# Patient Record
Sex: Female | Born: 1960
Health system: Southern US, Community
[De-identification: ages and names within clinical notes are randomized; demographics above are authoritative.]

## PROBLEM LIST (undated history)

## (undated) DIAGNOSIS — R32 Unspecified urinary incontinence: Secondary | ICD-10-CM

## (undated) DIAGNOSIS — C801 Malignant (primary) neoplasm, unspecified: Secondary | ICD-10-CM

## (undated) DIAGNOSIS — M1990 Unspecified osteoarthritis, unspecified site: Secondary | ICD-10-CM

## (undated) DIAGNOSIS — IMO0002 Reserved for concepts with insufficient information to code with codable children: Secondary | ICD-10-CM

## (undated) DIAGNOSIS — R87810 Cervical high risk human papillomavirus (HPV) DNA test positive: Secondary | ICD-10-CM

## (undated) HISTORY — DX: Cervical high risk human papillomavirus (HPV) DNA test positive: R87.810

## (undated) HISTORY — PX: LASIK: SHX215

## (undated) HISTORY — DX: Unspecified osteoarthritis, unspecified site: M19.90

## (undated) HISTORY — PX: TONSILLECTOMY AND ADENOIDECTOMY: SHX28

## (undated) HISTORY — PX: COLPOSCOPY: SHX161

## (undated) HISTORY — DX: Reserved for concepts with insufficient information to code with codable children: IMO0002

## (undated) HISTORY — DX: Malignant (primary) neoplasm, unspecified: C80.1

## (undated) HISTORY — DX: Unspecified urinary incontinence: R32

---
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
## 1998-04-23 ENCOUNTER — Ambulatory Visit (HOSPITAL_COMMUNITY): Admission: RE | Admit: 1998-04-23 | Discharge: 1998-04-23 | Payer: Self-pay | Admitting: Obstetrics and Gynecology

## 1998-10-06 ENCOUNTER — Other Ambulatory Visit: Admission: RE | Admit: 1998-10-06 | Discharge: 1998-10-06 | Payer: Self-pay | Admitting: Obstetrics and Gynecology

## 1998-10-14 ENCOUNTER — Ambulatory Visit (HOSPITAL_COMMUNITY): Admission: RE | Admit: 1998-10-14 | Discharge: 1998-10-14 | Payer: Self-pay | Admitting: Obstetrics and Gynecology

## 1999-08-23 ENCOUNTER — Other Ambulatory Visit: Admission: RE | Admit: 1999-08-23 | Discharge: 1999-08-23 | Payer: Self-pay | Admitting: Gynecology

## 2000-03-24 ENCOUNTER — Inpatient Hospital Stay (HOSPITAL_COMMUNITY): Admission: AD | Admit: 2000-03-24 | Discharge: 2000-03-27 | Payer: Self-pay | Admitting: Obstetrics and Gynecology

## 2000-05-14 ENCOUNTER — Other Ambulatory Visit: Admission: RE | Admit: 2000-05-14 | Discharge: 2000-05-14 | Payer: Self-pay | Admitting: Gynecology

## 2001-05-29 ENCOUNTER — Other Ambulatory Visit: Admission: RE | Admit: 2001-05-29 | Discharge: 2001-05-29 | Payer: Self-pay | Admitting: Gynecology

## 2001-10-29 ENCOUNTER — Other Ambulatory Visit: Admission: RE | Admit: 2001-10-29 | Discharge: 2001-10-29 | Payer: Self-pay | Admitting: Gynecology

## 2002-04-01 ENCOUNTER — Other Ambulatory Visit: Admission: RE | Admit: 2002-04-01 | Discharge: 2002-04-01 | Payer: Self-pay | Admitting: Gynecology

## 2003-12-08 ENCOUNTER — Other Ambulatory Visit: Admission: RE | Admit: 2003-12-08 | Discharge: 2003-12-08 | Payer: Self-pay | Admitting: Gynecology

## 2004-01-12 ENCOUNTER — Ambulatory Visit (HOSPITAL_COMMUNITY): Admission: RE | Admit: 2004-01-12 | Discharge: 2004-01-12 | Payer: Self-pay | Admitting: Gynecology

## 2004-08-09 ENCOUNTER — Other Ambulatory Visit: Admission: RE | Admit: 2004-08-09 | Discharge: 2004-08-09 | Payer: Self-pay | Admitting: Gynecology

## 2005-01-24 ENCOUNTER — Other Ambulatory Visit: Admission: RE | Admit: 2005-01-24 | Discharge: 2005-01-24 | Payer: Self-pay | Admitting: Gynecology

## 2005-07-12 ENCOUNTER — Ambulatory Visit (HOSPITAL_COMMUNITY): Admission: RE | Admit: 2005-07-12 | Discharge: 2005-07-12 | Payer: Self-pay | Admitting: Gynecology

## 2005-07-24 ENCOUNTER — Encounter: Admission: RE | Admit: 2005-07-24 | Discharge: 2005-07-24 | Payer: Self-pay | Admitting: Gynecology

## 2006-01-29 ENCOUNTER — Other Ambulatory Visit: Admission: RE | Admit: 2006-01-29 | Discharge: 2006-01-29 | Payer: Self-pay | Admitting: Gynecology

## 2006-07-13 ENCOUNTER — Ambulatory Visit (HOSPITAL_BASED_OUTPATIENT_CLINIC_OR_DEPARTMENT_OTHER): Admission: RE | Admit: 2006-07-13 | Discharge: 2006-07-13 | Payer: Self-pay | Admitting: Orthopedic Surgery

## 2006-07-25 ENCOUNTER — Ambulatory Visit (HOSPITAL_COMMUNITY): Admission: RE | Admit: 2006-07-25 | Discharge: 2006-07-25 | Payer: Self-pay | Admitting: Gynecology

## 2007-02-07 ENCOUNTER — Other Ambulatory Visit: Admission: RE | Admit: 2007-02-07 | Discharge: 2007-02-07 | Payer: Self-pay | Admitting: Gynecology

## 2007-07-29 ENCOUNTER — Ambulatory Visit (HOSPITAL_COMMUNITY): Admission: RE | Admit: 2007-07-29 | Discharge: 2007-07-29 | Payer: Self-pay | Admitting: Gynecology

## 2008-04-02 ENCOUNTER — Other Ambulatory Visit: Admission: RE | Admit: 2008-04-02 | Discharge: 2008-04-02 | Payer: Self-pay | Admitting: Gynecology

## 2008-09-01 ENCOUNTER — Ambulatory Visit (HOSPITAL_COMMUNITY): Admission: RE | Admit: 2008-09-01 | Discharge: 2008-09-01 | Payer: Self-pay | Admitting: Gynecology

## 2009-04-06 ENCOUNTER — Other Ambulatory Visit: Admission: RE | Admit: 2009-04-06 | Discharge: 2009-04-06 | Payer: Self-pay | Admitting: Gynecology

## 2009-04-06 ENCOUNTER — Ambulatory Visit: Payer: Self-pay | Admitting: Gynecology

## 2009-04-06 ENCOUNTER — Encounter: Payer: Self-pay | Admitting: Gynecology

## 2009-11-11 ENCOUNTER — Ambulatory Visit (HOSPITAL_COMMUNITY): Admission: RE | Admit: 2009-11-11 | Discharge: 2009-11-11 | Payer: Self-pay | Admitting: Gynecology

## 2010-05-19 ENCOUNTER — Encounter: Payer: Self-pay | Admitting: Internal Medicine

## 2010-10-02 HISTORY — PX: HAND SURGERY: SHX662

## 2010-11-03 NOTE — Letter (Signed)
Summary: [facility]  [facility]   Imported By: Sherian Rein 05/31/2010 14:06:52  _____________________________________________________________________  External Attachment:    Type:   Image     Comment:   External Document

## 2011-02-17 NOTE — Discharge Summary (Signed)
[facility]  PatientALIANAH, [name]                            MRN: [id number] Adm. Date:  [id number] Disc. Date: [id number] Attending:  Tonye Royalty Dictator:   Antony Contras, N.P. CC:         Nadyne Coombes. Fontaine, M.D.                           Discharge Summary  DISCHARGE DIAGNOSIS:          Intrauterine pregnancy at term, spontaneous                               with spontaneous onset of labor.   PROCEDURE:                    Vacuum-assisted vaginal delivery over a midline                               episiotomy, with delivery of a viable female                               infant.  HISTORY OF PRESENT ILLNESS:   The patient is a 50 year old gravida 3, para 1, 0, 1, 1, with an LMP of June 12, 1999, [facility] of March 18, 2000.  The prenatal course was complicated by the advanced maternal age.  The prenatal labs were as follows: Blood type O-positive, antibody screen negative, rubella, HBSAG, HIV nonreactive. Rubella immune.  SMAFP within normal limits.  Amniocentesis revealed normal chromosomes.  GBS culture was positive.  HOSPITAL COURSE:              The patient was admitted on March 24, 2000, with contractions two to four minutes apart.  The cervix on admission was 2.0 cm, 90%, vertex at a -1 station.  Labor was augmented with Pitocin.  The patient was also given clindamycin for a positive GBS status.  She did progress to complete dilatation and was delivered of an Apgar 7 and 8 female, infant weighing 7 pounds 7 ounces over a midline episiotomy.  Delivery was accomplished with a Tender-Touch vacuum, secondary to deep variable decelerations.  The postpartum course was uncomplicated.  She remained afebrile.  No difficulty voiding.  A CBC: Hematocrit 37.1, hemoglobin 12.6, wbcs 13.2, platelets 141.  She was able to be discharged on her second postpartum day in satisfactory condition.  DISPOSITION:                  Follow up in  the office in six weeks.  Continue with prenatal vitamins, Motrin and Tylox for pain. DD:  04/23/00 TD:  04/25/00 Job: [id number]

## 2011-02-17 NOTE — Op Note (Signed)
NAMECASSADIE, PANKONIN                   ACCOUNT NO.:  [id number]   MEDICAL RECORD NO.:  [id number]          PATIENT TYPE:  AMB   LOCATION:  DSC                          FACILITY:  [facility]   PHYSICIAN:  Katy Fitch. Sypher, M.D. 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   OPERATIONS:  Excisional debridement of mucoid cyst and irrigation  debridement of right long finger PIP joint through dorsal radial arthrotomy.   SURGEON:  Katy Fitch. Sypher, M.D.   ASSISTANT:  Jonni Sanger, P.A.   ANESTHESIA:  2% lidocaine metacarpal head level block right long finger  supplemented by IV sedation, supervising anesthesiologist is Dr. Krista Blue.   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   Clinical examination revealed signs of generalized osteoarthrosis with  hypertrophic osteophyte formation at her distal interphalangeal joints.   She had a chronic mucous cyst of right long finger unresponsive to local  treatment strategies.   She requested resection.   We advised her that we have a higher rate of success in eradicating chronic  mucoid cysts by cyst excision combined with DIP joint debridement to remove  loose bodies and other irritative predicaments.   After informed consent, she is brought to the operating room at this time.  During our informed consent, she was advised we cannot guarantee permanent  resolution of this predicament as it is related to the ongoing evolution of  degenerative arthritis of the distal interphalangeal joints.   DESCRIPTION  OF PROCEDURE:  Johari Vandeven is brought to operating room and placed  in supine position on the table.   Following the induction of light sedation the right arm was prepped Betadine  soap solution, sterilely draped.   The right long finger was then exsanguinated with gauze wrap and a 1/2-inch  Penrose drain placed in the base of fingers digital tourniquet.   Procedure commenced with a curvilinear incision beginning along the radial  lateral aspect of the middle phalanx neck and head and moving to the midline  of the finger at the site of the cyst.   Subcutaneous tissues were carefully divided attempting to identify the neck  of the cyst.   We are unable to easily identify the sinus tract of the joint.  Therefore  the cyst was then dissected retrograde and removed in its entirety.   The DIP joint was then ranged and no evidence of leaking joint fluid was  noted.   An arthrotomy was performed between the radial collateral ligament and  central extensor tendon slip.  The capsule was dissected.  A micro curette  was then used to debride the marginal osteophyte at the base of the distal  phalanx.  The micro curette was then used to perform a synovectomy of the  dorsal aspect of the DIP joint followed by thorough irrigation utilizing a  blunt dental needle and 20 mL of sterile saline under pressure.   After our best efforts to thoroughly irrigate the joint until the affluent  was clear the wound was then repaired with mattress suture of 5-0 nylon.   No apparent complications.  The wound was dressed with Xeroflo, sterile  gauze, and a Coban finger dressing.   We will advise Ms. Worrel to change her dressing in approximately 3 days to a  small Coban dressing or Band-Aid.   She will return to our office for follow-up in 1 week.      Katy Fitch Sypher, M.D.  Electronically Signed     RVS/MEDQ  D:  07/13/2006  T:  07/15/2006  Job:  [id number]

## 2011-08-14 ENCOUNTER — Other Ambulatory Visit: Payer: Self-pay | Admitting: Gynecology

## 2011-08-14 DIAGNOSIS — Z1231 Encounter for screening mammogram for malignant neoplasm of breast: Secondary | ICD-10-CM

## 2011-10-05 ENCOUNTER — Ambulatory Visit (HOSPITAL_COMMUNITY)
Admission: RE | Admit: 2011-10-05 | Discharge: 2011-10-05 | Disposition: A | Discharge status: Home / Self Care | Payer: Federal, State, Local not specified - PPO | Source: Ambulatory Visit | Attending: Gynecology | Admitting: Gynecology

## 2011-10-05 DIAGNOSIS — Z1231 Encounter for screening mammogram for malignant neoplasm of breast: Secondary | ICD-10-CM | POA: Insufficient documentation

## 2011-10-10 ENCOUNTER — Encounter: Payer: Self-pay | Admitting: Gynecology

## 2011-10-10 ENCOUNTER — Ambulatory Visit (INDEPENDENT_AMBULATORY_CARE_PROVIDER_SITE_OTHER): Payer: Federal, State, Local not specified - PPO | Admitting: Gynecology

## 2011-10-10 ENCOUNTER — Encounter: Payer: Self-pay | Admitting: Anesthesiology

## 2011-10-10 ENCOUNTER — Other Ambulatory Visit (HOSPITAL_COMMUNITY)
Admission: RE | Admit: 2011-10-10 | Discharge: 2011-10-10 | Disposition: A | Discharge status: Home / Self Care | Payer: Federal, State, Local not specified - PPO | Source: Ambulatory Visit | Attending: Gynecology | Admitting: Gynecology

## 2011-10-10 ENCOUNTER — Encounter: Payer: Federal, State, Local not specified - PPO | Admitting: Gynecology

## 2011-10-10 ENCOUNTER — Other Ambulatory Visit: Payer: Self-pay | Admitting: Gynecology

## 2011-10-10 ENCOUNTER — Telehealth: Payer: Self-pay | Admitting: *Deleted

## 2011-10-10 VITALS — BP 120/80 | Ht 62.0 in | Wt 139.0 lb

## 2011-10-10 DIAGNOSIS — Z309 Encounter for contraceptive management, unspecified: Secondary | ICD-10-CM

## 2011-10-10 DIAGNOSIS — Z131 Encounter for screening for diabetes mellitus: Secondary | ICD-10-CM

## 2011-10-10 DIAGNOSIS — B373 Candidiasis of vulva and vagina: Secondary | ICD-10-CM

## 2011-10-10 DIAGNOSIS — Z13220 Encounter for screening for lipoid disorders: Secondary | ICD-10-CM

## 2011-10-10 DIAGNOSIS — Z01419 Encounter for gynecological examination (general) (routine) without abnormal findings: Secondary | ICD-10-CM

## 2011-10-10 DIAGNOSIS — N390 Urinary tract infection, site not specified: Secondary | ICD-10-CM

## 2011-10-10 LAB — URINALYSIS, ROUTINE W REFLEX MICROSCOPIC
Bilirubin Urine: NEGATIVE
Glucose, UA: NEGATIVE mg/dL
Ketones, ur: NEGATIVE mg/dL
Leukocytes, UA: NEGATIVE
Nitrite: NEGATIVE
Protein, ur: NEGATIVE mg/dL
Specific Gravity, Urine: 1.01 (ref 1.005–1.030)
Urobilinogen, UA: 0.2 mg/dL (ref 0.0–1.0)

## 2011-10-10 LAB — URINALYSIS, MICROSCOPIC ONLY
Casts: NONE SEEN
Crystals: NONE SEEN

## 2011-10-10 MED ORDER — FLUCONAZOLE 150 MG PO TABS: 150.0000 mg | ORAL_TABLET | Freq: Once | ORAL | Status: AC

## 2011-10-10 NOTE — Patient Instructions (Signed)
Followup in one year for annual GYN exam. 

## 2011-10-10 NOTE — Progress Notes (Addendum)
[name] [birth year] [id number]        51 y.o.  for annual exam.  Doing well no complaints.  Past medical history,surgical history, medications, allergies, family history and social history were all reviewed and documented in the EPIC chart. ROS:  Was performed and pertinent positives and negatives are included in the history.  Exam: [name] chaperone present Filed Vitals:   10/10/11 1048  BP: 120/80   General appearance  Normal Skin grossly normal Head/Neck normal with no cervical or supraclavicular adenopathy thyroid normal Lungs  clear Cardiac RR, without RMG Abdominal  soft, nontender, without masses, organomegaly or hernia Breasts  examined lying and sitting without masses, retractions, discharge or axillary adenopathy. Pelvic  Ext/BUS/vagina  normal   Cervix  normal  Pap done  Uterus  anteverted, normal size, shape and contour, midline and mobile nontender   Adnexa  Without masses or tenderness    Anus and perineum  normal   Rectovaginal  normal sphincter tone without palpated masses or tenderness.    Assessment/Plan:  51 y.o. female for annual exam.   Doing well with regular menses. 1. Contraception. She is using condoms was acceptable to her. I reviewed the failure risk and alternatives and she would prefer condoms. I discussed Plan B availability in case there is a break or slippage of the condom. 2. Mammogram. Patient just had her mammogram. We'll continue with annual mammograms. SBE monthly reviewed. 3. Pap smear. She has not been in in several years a Pap was done today. She has a history of low-grade SIL October 2002 on colposcopy and biopsy. Expectant management with follow up Pap smears have been negative. 4. Health maintenance. Baseline CBC glucose lipid profile urinalysis ordered. Assuming she continues well from a gynecologic standpoint she will see me in a year, sooner as needed. Addendum: After patient left her urinalysis returns showing yeast. I am going to treat  her with Diflucan 150 mg tablet times one and the office staff will call and tell her.   [name] MD, 11:09 AM 10/10/2011

## 2011-10-10 NOTE — Telephone Encounter (Signed)
Message copied by Aura Camps on Tue Oct 10, 2011 12:08 PM ------      Message from: [name]      Created: Tue Oct 10, 2011 12:00 PM       Call patient and tell her that her urine analysis did show yeast which I think is from a vaginal contaminant. I prescribed Diflucan 150 mg times one pill and she can take this up at her pharmacy.

## 2011-10-10 NOTE — Telephone Encounter (Signed)
Pt informed with the below note and will pick up rx

## 2011-10-11 ENCOUNTER — Other Ambulatory Visit: Payer: Self-pay | Admitting: *Deleted

## 2011-10-11 DIAGNOSIS — E7800 Pure hypercholesterolemia, unspecified: Secondary | ICD-10-CM

## 2011-10-11 LAB — CBC WITH DIFFERENTIAL/PLATELET
Basophils Absolute: 0.1 10*3/uL (ref 0.0–0.1)
Basophils Relative: 1 % (ref 0–1)
Eosinophils Absolute: 0.3 10*3/uL (ref 0.0–0.7)
Eosinophils Relative: 4 % (ref 0–5)
HCT: 41.8 % (ref 36.0–46.0)
Hemoglobin: 14.5 g/dL (ref 12.0–15.0)
Lymphocytes Relative: 38 % (ref 12–46)
Lymphs Abs: 3.1 10*3/uL (ref 0.7–4.0)
MCH: 30.7 pg (ref 26.0–34.0)
MCHC: 34.7 g/dL (ref 30.0–36.0)
MCV: 88.4 fL (ref 78.0–100.0)
Monocytes Relative: 9 % (ref 3–12)
Neutro Abs: 3.9 10*3/uL (ref 1.7–7.7)
Neutrophils Relative %: 48 % (ref 43–77)
Platelets: 239 10*3/uL (ref 150–400)
RBC: 4.73 MIL/uL (ref 3.87–5.11)
RDW: 13.7 % (ref 11.5–15.5)

## 2011-10-11 LAB — LIPID PANEL
Cholesterol: 200 mg/dL (ref 0–200)
HDL: 47 mg/dL (ref >39)
LDL Cholesterol: 123 mg/dL — ABNORMAL HIGH (ref 0–99)
Total CHOL/HDL Ratio: 4.3 Ratio
VLDL: 30 mg/dL (ref 0–40)

## 2011-10-11 LAB — GLUCOSE, RANDOM: Glucose, Bld: 82 mg/dL (ref 70–99)

## 2011-10-12 LAB — URINE CULTURE: Colony Count: 45000

## 2011-10-12 MED ORDER — FLUCONAZOLE 150 MG PO TABS: 150.0000 mg | ORAL_TABLET | Freq: Once | ORAL | Status: AC

## 2011-10-12 NOTE — Progress Notes (Signed)
Addended by: [name] on: 10/12/2011 02:17 PM   Modules accepted: Orders

## 2012-09-05 ENCOUNTER — Other Ambulatory Visit: Payer: Self-pay | Admitting: Dermatology

## 2012-11-30 DIAGNOSIS — R87810 Cervical high risk human papillomavirus (HPV) DNA test positive: Secondary | ICD-10-CM

## 2012-11-30 HISTORY — DX: Cervical high risk human papillomavirus (HPV) DNA test positive: R87.810

## 2012-12-17 ENCOUNTER — Ambulatory Visit (INDEPENDENT_AMBULATORY_CARE_PROVIDER_SITE_OTHER): Payer: Federal, State, Local not specified - PPO | Admitting: Gynecology

## 2012-12-17 ENCOUNTER — Other Ambulatory Visit (HOSPITAL_COMMUNITY)
Admission: RE | Admit: 2012-12-17 | Discharge: 2012-12-17 | Disposition: A | Discharge status: Home / Self Care | Payer: Federal, State, Local not specified - PPO | Source: Ambulatory Visit | Attending: Gynecology | Admitting: Gynecology

## 2012-12-17 ENCOUNTER — Other Ambulatory Visit: Payer: Self-pay | Admitting: Gynecology

## 2012-12-17 ENCOUNTER — Encounter: Payer: Self-pay | Admitting: Gynecology

## 2012-12-17 VITALS — BP 122/74 | Ht 61.0 in | Wt 142.0 lb

## 2012-12-17 DIAGNOSIS — R87810 Cervical high risk human papillomavirus (HPV) DNA test positive: Secondary | ICD-10-CM | POA: Insufficient documentation

## 2012-12-17 DIAGNOSIS — Z13220 Encounter for screening for lipoid disorders: Secondary | ICD-10-CM

## 2012-12-17 DIAGNOSIS — Z01419 Encounter for gynecological examination (general) (routine) without abnormal findings: Secondary | ICD-10-CM

## 2012-12-17 DIAGNOSIS — Z1231 Encounter for screening mammogram for malignant neoplasm of breast: Secondary | ICD-10-CM

## 2012-12-17 DIAGNOSIS — N3941 Urge incontinence: Secondary | ICD-10-CM

## 2012-12-17 DIAGNOSIS — Z1151 Encounter for screening for human papillomavirus (HPV): Secondary | ICD-10-CM | POA: Insufficient documentation

## 2012-12-17 LAB — COMPREHENSIVE METABOLIC PANEL
ALT: 13 U/L (ref 0–35)
AST: 17 U/L (ref 0–37)
Albumin: 4.8 g/dL (ref 3.5–5.2)
Alkaline Phosphatase: 66 U/L (ref 39–117)
BUN: 14 mg/dL (ref 6–23)
CO2: 27 mEq/L (ref 19–32)
Calcium: 9.4 mg/dL (ref 8.4–10.5)
Chloride: 102 mEq/L (ref 96–112)
Creat: 0.77 mg/dL (ref 0.50–1.10)
Glucose, Bld: 85 mg/dL (ref 70–99)
Potassium: 4 mEq/L (ref 3.5–5.3)
Sodium: 136 mEq/L (ref 135–145)
Total Bilirubin: 0.4 mg/dL (ref 0.3–1.2)
Total Protein: 7.3 g/dL (ref 6.0–8.3)

## 2012-12-17 LAB — CBC WITH DIFFERENTIAL/PLATELET
Basophils Absolute: 0 10*3/uL (ref 0.0–0.1)
Basophils Relative: 0 % (ref 0–1)
Eosinophils Absolute: 0.4 10*3/uL (ref 0.0–0.7)
Eosinophils Relative: 4 % (ref 0–5)
HCT: 41.5 % (ref 36.0–46.0)
Hemoglobin: 14.3 g/dL (ref 12.0–15.0)
Lymphocytes Relative: 31 % (ref 12–46)
Lymphs Abs: 3.1 10*3/uL (ref 0.7–4.0)
MCH: 29.2 pg (ref 26.0–34.0)
MCHC: 34.5 g/dL (ref 30.0–36.0)
MCV: 84.9 fL (ref 78.0–100.0)
Monocytes Absolute: 0.9 10*3/uL (ref 0.1–1.0)
Monocytes Relative: 8 % (ref 3–12)
Neutro Abs: 5.8 10*3/uL (ref 1.7–7.7)
Neutrophils Relative %: 57 % (ref 43–77)
Platelets: 254 10*3/uL (ref 150–400)
RBC: 4.89 MIL/uL (ref 3.87–5.11)
RDW: 14 % (ref 11.5–15.5)
WBC: 10.1 10*3/uL (ref 4.0–10.5)

## 2012-12-17 LAB — LIPID PANEL
Cholesterol: 187 mg/dL (ref 0–200)
HDL: 45 mg/dL (ref >39)
LDL Cholesterol: 97 mg/dL (ref 0–99)
Total CHOL/HDL Ratio: 4.2 Ratio
Triglycerides: 227 mg/dL — ABNORMAL HIGH (ref <150)
VLDL: 45 mg/dL — ABNORMAL HIGH (ref 0–40)

## 2012-12-17 LAB — TSH: TSH: 3.339 u[IU]/mL (ref 0.350–4.500)

## 2012-12-17 NOTE — Progress Notes (Signed)
[name] 03-26-1961 [id number]        52 y.o.  [id number] for annual exam.  Several issues noted below.  Past medical history,surgical history, medications, allergies, family history and social history were all reviewed and documented in the EPIC chart. ROS:  Was performed and pertinent positives and negatives are included in the history.  Exam: Kim assistant Filed Vitals:   12/17/12 1450  BP: 122/74  Height: 5\' 1"  (1.549 m)  Weight: 142 lb (64.411 kg)   General appearance  Normal Skin grossly normal Head/Neck normal with no cervical or supraclavicular adenopathy thyroid normal Lungs  clear Cardiac RR, without RMG Abdominal  soft, nontender, without masses, organomegaly or hernia Breasts  examined lying and sitting without masses, retractions, discharge or axillary adenopathy. Pelvic  Ext/BUS/vagina  normal   Cervix  normal Pap/HPV  Uterus  anteverted, normal size, shape and contour, midline and mobile nontender   Adnexa  Without masses or tenderness    Anus and perineum  normal   Rectovaginal  normal sphincter tone without palpated masses or tenderness.    Assessment/Plan:  52 y.o. [id number] female for annual exam.   1. Regular menses without menopausal symptoms.  Barrier contraception. Have discussed failure risks with this unavailability of Plan B. 2. Urge incontinence. Classic history. Mostly urgency his complaint occasional loss of urine. Options reviewed. Patient wants trial of Enablex 15 mg one month sample given. She'll call in a month to let me know how she's doing. Check UA today. 3. Mammography scheduled in April. SBE monthly reviewed. 4. Pap smear 2013. Pap/HPV done today. History of LGSIL 2002. Assuming negative we'll plan less frequent screening interval. 5. Colonoscopy. Commended baseline colonoscopy as she has turned 51 and she agrees to schedule this. Names given. 6. Health maintenance. Does see Dr. Posey Rea but has not had blood work done in some time.  Baseline  CBC comprehensive metabolic panel lipid profile urinalysis TSH vitamin D done. Followup with results from Enablex trial. Otherwise followup in 1 year.   [name] MD, 3:38 PM 12/17/2012

## 2012-12-17 NOTE — Patient Instructions (Addendum)
Start on Enablex daily. Call me in 4 weeks to let me now how you're doing. Schedule a colonoscopy with [place] [facility] Followup in 1 year for annual exam.

## 2012-12-18 LAB — URINALYSIS W MICROSCOPIC + REFLEX CULTURE
Bacteria, UA: NONE SEEN
Bilirubin Urine: NEGATIVE
Casts: NONE SEEN
Crystals: NONE SEEN
Glucose, UA: NEGATIVE mg/dL
Hgb urine dipstick: NEGATIVE
Ketones, ur: NEGATIVE mg/dL
Nitrite: NEGATIVE
Protein, ur: NEGATIVE mg/dL
Specific Gravity, Urine: 1.017 (ref 1.005–1.030)
Squamous Epithelial / HPF: NONE SEEN
Urobilinogen, UA: 0.2 mg/dL (ref 0.0–1.0)
pH: 5.5 (ref 5.0–8.0)

## 2012-12-18 LAB — VITAMIN D 25 HYDROXY (VIT D DEFICIENCY, FRACTURES): Vit D, 25-Hydroxy: 37 ng/mL (ref 30–89)

## 2012-12-20 ENCOUNTER — Other Ambulatory Visit: Payer: Self-pay | Admitting: Gynecology

## 2012-12-20 MED ORDER — SULFAMETHOXAZOLE-TRIMETHOPRIM 800-160 MG PO TABS: 1.0000 | ORAL_TABLET | Freq: Two times a day (BID) | ORAL | Status: AC

## 2012-12-21 LAB — URINE CULTURE: Colony Count: 30000

## 2012-12-26 ENCOUNTER — Encounter: Payer: Self-pay | Admitting: Gynecology

## 2013-01-13 ENCOUNTER — Ambulatory Visit (HOSPITAL_COMMUNITY)
Admission: RE | Admit: 2013-01-13 | Discharge: 2013-01-13 | Disposition: A | Discharge status: Home / Self Care | Payer: Federal, State, Local not specified - PPO | Source: Ambulatory Visit | Attending: Gynecology | Admitting: Gynecology

## 2013-01-13 ENCOUNTER — Ambulatory Visit (HOSPITAL_COMMUNITY): Payer: Federal, State, Local not specified - PPO

## 2013-01-13 DIAGNOSIS — Z1231 Encounter for screening mammogram for malignant neoplasm of breast: Secondary | ICD-10-CM

## 2013-01-21 ENCOUNTER — Other Ambulatory Visit: Payer: Self-pay | Admitting: Orthopedic Surgery

## 2013-01-24 ENCOUNTER — Encounter (HOSPITAL_BASED_OUTPATIENT_CLINIC_OR_DEPARTMENT_OTHER): Payer: Self-pay | Admitting: *Deleted

## 2013-01-29 NOTE — H&P (Signed)
  [name] is an 52 y.o. female.   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  She is status post debridement of a mucoid cyst with DIP joint debridement of loose body removal on the right in 2007.  She now presents for similar attention to the left hand.  She has familial hypertrophic osteoarthritis with both her mother and father experiencing significant osteoarthritis.     Past Medical History  Diagnosis Date  . LGSIL (low grade squamous intraepithelial dysplasia)     07/2001  . Urinary incontinence     [facility]I  . Cervical high risk HPV (human papillomavirus) test positive 11/2012    Normal cytology recommend repeat Pap with HPV one year    Past Surgical History  Procedure Laterality Date  . Tonsillectomy and adenoidectomy    . Mouth surgery  1981  . Hand surgery  2012    RIGHT HAND MIDDLE FINGER  . Lasik      Family History  Problem Relation Age of Onset  . Hypertension Mother   . Stroke Mother   . Diabetes Father   . Hypertension Father   . Cancer Father     SKIN  . Stroke Maternal Grandmother    Social History:  reports that she has never smoked. She has never used smokeless tobacco. She reports that  drinks alcohol. She reports that she does not use illicit drugs.  Allergies:  Allergies  Allergen Reactions  . Penicillins Rash    Rash, hives     No prescriptions prior to admission    No results found for this or any previous visit (from the past 48 hour(s)).  No results found.   Pertinent items are noted in HPI.  Height 5' 1.5" (1.562 m), weight 61.689 kg (136 lb), last menstrual period 01/20/2013.  General appearance: alert Head: Normocephalic, without obvious abnormality Neck: supple, symmetrical, trachea midline Resp: clear to auscultation bilaterally Cardio:  regular rate and rhythm GI: normal findings: bowel sounds normal Extremities: Inspection of her hands reveals Heberden's and Bouchard's nodes.  She has a 6 mm.  in diameter mucoid cyst in the dorsoulnar aspect of her left long finger. She has no nail deformity.  She maintains full motion.  Her scar is well healed on the right.  There is no sign of recurrence.  She does have hypertrophic osteoarthritis of her DIP joint of the index and long finger on the right with palpable osteophytes Pulses: 2+ and symmetric Skin: normal Neurologic: Grossly normal    Assessment/Plan Impression: Left long finger mucoid cyst DIP joint  Plan: To the OR for excision of cyst and DIP joint debridement left long finger.The procedure, risks,benefits and post-op course were discussed with the patient at length and they were in agreement with the plan.  DASNOIT,Yarden Hillis J 01/29/2013, 3:46 PM  H&P documentation: 01/30/2013  -History and Physical Reviewed  -Patient has been re-examined  -No change in the plan of care  Wyn Forster, MD

## 2013-01-30 ENCOUNTER — Ambulatory Visit (HOSPITAL_BASED_OUTPATIENT_CLINIC_OR_DEPARTMENT_OTHER): Payer: Federal, State, Local not specified - PPO | Admitting: Anesthesiology

## 2013-01-30 ENCOUNTER — Encounter (HOSPITAL_BASED_OUTPATIENT_CLINIC_OR_DEPARTMENT_OTHER): Payer: Self-pay

## 2013-01-30 ENCOUNTER — Encounter (HOSPITAL_BASED_OUTPATIENT_CLINIC_OR_DEPARTMENT_OTHER)
Admission: RE | Disposition: A | Discharge status: Home / Self Care | Payer: Self-pay | Source: Ambulatory Visit | Attending: Orthopedic Surgery

## 2013-01-30 ENCOUNTER — Ambulatory Visit (HOSPITAL_BASED_OUTPATIENT_CLINIC_OR_DEPARTMENT_OTHER)
Admission: RE | Admit: 2013-01-30 | Discharge: 2013-01-30 | Disposition: A | Discharge status: Home / Self Care | Payer: Federal, State, Local not specified - PPO | Source: Ambulatory Visit | Attending: Orthopedic Surgery | Admitting: Orthopedic Surgery

## 2013-01-30 ENCOUNTER — Encounter (HOSPITAL_BASED_OUTPATIENT_CLINIC_OR_DEPARTMENT_OTHER): Payer: Self-pay | Admitting: Anesthesiology

## 2013-01-30 DIAGNOSIS — R32 Unspecified urinary incontinence: Secondary | ICD-10-CM | POA: Insufficient documentation

## 2013-01-30 DIAGNOSIS — R87810 Cervical high risk human papillomavirus (HPV) DNA test positive: Secondary | ICD-10-CM | POA: Insufficient documentation

## 2013-01-30 DIAGNOSIS — M19049 Primary osteoarthritis, unspecified hand: Secondary | ICD-10-CM | POA: Insufficient documentation

## 2013-01-30 DIAGNOSIS — Z880 Allergy status to penicillin: Secondary | ICD-10-CM | POA: Insufficient documentation

## 2013-01-30 DIAGNOSIS — M898X9 Other specified disorders of bone, unspecified site: Secondary | ICD-10-CM | POA: Insufficient documentation

## 2013-01-30 DIAGNOSIS — D2110 Benign neoplasm of connective and other soft tissue of unspecified upper limb, including shoulder: Secondary | ICD-10-CM | POA: Insufficient documentation

## 2013-01-30 SURGERY — EXCISION METACARPAL MASS
Anesthesia: Monitor Anesthesia Care | Site: Hand | Laterality: Left | Wound class: Clean

## 2013-01-30 MED ORDER — MIDAZOLAM HCL 5 MG/5ML IJ SOLN
INTRAMUSCULAR | Status: DC | PRN
  Administered 2013-01-30: 2 mg via INTRAVENOUS

## 2013-01-30 MED ORDER — OXYCODONE HCL 5 MG/5ML PO SOLN: 5.0000 mg | Freq: Once | ORAL | Status: DC | PRN

## 2013-01-30 MED ORDER — CHLORHEXIDINE GLUCONATE 4 % EX LIQD: 60.0000 mL | Freq: Once | CUTANEOUS | Status: DC

## 2013-01-30 MED ORDER — OXYCODONE HCL 5 MG PO TABS: 5.0000 mg | ORAL_TABLET | Freq: Once | ORAL | Status: DC | PRN

## 2013-01-30 MED ORDER — ONDANSETRON HCL 4 MG/2ML IJ SOLN: 4.0000 mg | Freq: Once | INTRAMUSCULAR | Status: DC | PRN

## 2013-01-30 MED ORDER — HYDROMORPHONE HCL PF 1 MG/ML IJ SOLN: 0.2500 mg | INTRAMUSCULAR | Status: DC | PRN

## 2013-01-30 MED ORDER — LIDOCAINE HCL 2 % IJ SOLN
INTRAMUSCULAR | Status: DC | PRN
  Administered 2013-01-30: 405 mL

## 2013-01-30 MED ORDER — MEPERIDINE HCL 25 MG/ML IJ SOLN: 6.2500 mg | INTRAMUSCULAR | Status: DC | PRN

## 2013-01-30 MED ORDER — LACTATED RINGERS IV SOLN
INTRAVENOUS | Status: DC
  Administered 2013-01-30 (×2): via INTRAVENOUS

## 2013-01-30 MED ORDER — LIDOCAINE HCL (CARDIAC) 20 MG/ML IV SOLN
INTRAVENOUS | Status: DC | PRN
  Administered 2013-01-30: 50 mg via INTRAVENOUS

## 2013-01-30 MED ORDER — MIDAZOLAM HCL 2 MG/2ML IJ SOLN: 1.0000 mg | INTRAMUSCULAR | Status: DC | PRN

## 2013-01-30 MED ORDER — FENTANYL CITRATE 0.05 MG/ML IJ SOLN: 50.0000 ug | INTRAMUSCULAR | Status: DC | PRN

## 2013-01-30 MED ORDER — OXYCODONE-ACETAMINOPHEN 5-325 MG PO TABS: ORAL_TABLET | ORAL | Status: DC

## 2013-01-30 MED ORDER — PROPOFOL 10 MG/ML IV EMUL
INTRAVENOUS | Status: DC | PRN
  Administered 2013-01-30: 75 ug/kg/min via INTRAVENOUS

## 2013-01-30 MED ORDER — FENTANYL CITRATE 0.05 MG/ML IJ SOLN
INTRAMUSCULAR | Status: DC | PRN
  Administered 2013-01-30: 25 ug via INTRAVENOUS
  Administered 2013-01-30: 50 ug via INTRAVENOUS
  Administered 2013-01-30: 25 ug via INTRAVENOUS

## 2013-01-30 SURGICAL SUPPLY — 52 items
BANDAGE ADHESIVE 1X3 (GAUZE/BANDAGES/DRESSINGS) IMPLANT
BANDAGE ELASTIC 3 VELCRO ST LF (GAUZE/BANDAGES/DRESSINGS) IMPLANT
BANDAGE GAUZE ELAST BULKY 4 IN (GAUZE/BANDAGES/DRESSINGS) IMPLANT
BLADE MINI RND TIP GREEN BEAV (BLADE) IMPLANT
BLADE SURG 15 STRL LF DISP TIS (BLADE) ×1 IMPLANT
BLADE SURG 15 STRL SS (BLADE) ×2
BNDG CMPR 9X4 STRL LF SNTH (GAUZE/BANDAGES/DRESSINGS)
BNDG CMPR MD 5X2 ELC HKLP STRL (GAUZE/BANDAGES/DRESSINGS)
BNDG COHESIVE 1X5 TAN STRL LF (GAUZE/BANDAGES/DRESSINGS) ×1 IMPLANT
BNDG ELASTIC 2 VLCR STRL LF (GAUZE/BANDAGES/DRESSINGS) IMPLANT
BNDG ESMARK 4X9 LF (GAUZE/BANDAGES/DRESSINGS) IMPLANT
BRUSH SCRUB EZ PLAIN DRY (MISCELLANEOUS) ×2 IMPLANT
CLOTH BEACON ORANGE TIMEOUT ST (SAFETY) ×2 IMPLANT
CORDS BIPOLAR (ELECTRODE) ×1 IMPLANT
COVER MAYO STAND STRL (DRAPES) ×2 IMPLANT
COVER TABLE BACK 60X90 (DRAPES) ×2 IMPLANT
CUFF TOURNIQUET SINGLE 18IN (TOURNIQUET CUFF) ×1 IMPLANT
DECANTER SPIKE VIAL GLASS SM (MISCELLANEOUS) IMPLANT
DRAIN PENROSE 1/2X12 LTX STRL (WOUND CARE) ×2 IMPLANT
DRAIN PENROSE 1/4X12 LTX STRL (WOUND CARE) IMPLANT
DRAPE EXTREMITY T 121X128X90 (DRAPE) ×2 IMPLANT
DRAPE SURG 17X23 STRL (DRAPES) ×2 IMPLANT
GAUZE XEROFORM 1X8 LF (GAUZE/BANDAGES/DRESSINGS) ×1 IMPLANT
GLOVE BIOGEL M STRL SZ7.5 (GLOVE) ×2 IMPLANT
GLOVE BIOGEL PI IND STRL 6.5 (GLOVE) IMPLANT
GLOVE BIOGEL PI INDICATOR 6.5 (GLOVE) ×1
GLOVE ECLIPSE 6.5 STRL STRAW (GLOVE) ×1 IMPLANT
GLOVE ORTHO TXT STRL SZ7.5 (GLOVE) ×2 IMPLANT
GOWN BRE IMP PREV XXLGXLNG (GOWN DISPOSABLE) ×3 IMPLANT
GOWN PREVENTION PLUS XLARGE (GOWN DISPOSABLE) ×2 IMPLANT
NDL BLUNT 17GA (NEEDLE) IMPLANT
NEEDLE 27GAX1X1/2 (NEEDLE) ×1 IMPLANT
NEEDLE BLUNT 17GA (NEEDLE) IMPLANT
NS IRRIG 1000ML POUR BTL (IV SOLUTION) ×1 IMPLANT
PACK BASIN DAY SURGERY FS (CUSTOM PROCEDURE TRAY) ×2 IMPLANT
PADDING CAST ABS 4INX4YD NS (CAST SUPPLIES) ×1
PADDING CAST ABS COTTON 4X4 ST (CAST SUPPLIES) ×1 IMPLANT
PADDING UNDERCAST 2  STERILE (CAST SUPPLIES) IMPLANT
SPONGE GAUZE 4X4 12PLY (GAUZE/BANDAGES/DRESSINGS) IMPLANT
STOCKINETTE 4X48 STRL (DRAPES) ×2 IMPLANT
STRIP CLOSURE SKIN 1/2X4 (GAUZE/BANDAGES/DRESSINGS) IMPLANT
SUT ETHILON 5 0 P 3 18 (SUTURE)
SUT MERSILENE 4 0 P 3 (SUTURE) IMPLANT
SUT NYLON ETHILON 5-0 P-3 1X18 (SUTURE) IMPLANT
SUT PROLENE 3 0 PS 2 (SUTURE) IMPLANT
SUT PROLENE 4 0 P 3 18 (SUTURE) ×1 IMPLANT
SYR 20CC LL (SYRINGE) IMPLANT
SYR 3ML 23GX1 SAFETY (SYRINGE) IMPLANT
SYR CONTROL 10ML LL (SYRINGE) IMPLANT
TOWEL OR 17X24 6PK STRL BLUE (TOWEL DISPOSABLE) ×4 IMPLANT
TRAY DSU PREP LF (CUSTOM PROCEDURE TRAY) ×2 IMPLANT
UNDERPAD 30X30 INCONTINENT (UNDERPADS AND DIAPERS) ×2 IMPLANT

## 2013-01-30 NOTE — Brief Op Note (Signed)
01/30/2013  11:20 AM  PATIENT:  [name]  52 y.o. female  PRE-OPERATIVE DIAGNOSIS:  MUCOID CYST LEFT LONG FINGER  POST-OPERATIVE DIAGNOSIS:  MUCOID CYST LEFT LONG FINGER  PROCEDURE:  Procedure(s): LEFT LONG MUCOID CYST EXCISION DIP DEBRIDEMENT (Left)  SURGEON:  Surgeon(s) and Role:    * Wyn Forster., MD - Primary  PHYSICIAN ASSISTANT:   ASSISTANTS: surgical technician   ANESTHESIA:   IV sedation  EBL:  Total I/O In: 1000 [I.V.:1000] Out: -   BLOOD ADMINISTERED:none  DRAINS: none   LOCAL MEDICATIONS USED:  XYLOCAINE   SPECIMEN:  No Specimen  DISPOSITION OF SPECIMEN:  N/A  COUNTS:  YES  TOURNIQUET:    DICTATION: .Other Dictation: Dictation Number [id number]  PLAN OF CARE: Discharge to home after PACU  PATIENT DISPOSITION:  PACU - hemodynamically stable.   Delay start of Pharmacological VTE agent (>24hrs) due to surgical blood loss or risk of bleeding: not applicable

## 2013-01-30 NOTE — Anesthesia Preprocedure Evaluation (Signed)
Anesthesia Evaluation  Patient identified by MRN, date of birth, ID band Patient awake    Reviewed: Allergy & Precautions, H&P , NPO status , Patient's Chart, lab work & pertinent test results  Airway Mallampati: I TM Distance: >3 FB Neck ROM: Full    Dental   Pulmonary          Cardiovascular     Neuro/Psych    GI/Hepatic   Endo/Other    Renal/GU      Musculoskeletal   Abdominal   Peds  Hematology   Anesthesia Other Findings   Reproductive/Obstetrics                           Anesthesia Physical Anesthesia Plan  ASA: II  Anesthesia Plan: MAC   Post-op Pain Management:    Induction: Intravenous  Airway Management Planned: Natural Airway  Additional Equipment:   Intra-op Plan:   Post-operative Plan: Extubation in OR  Informed Consent: I have reviewed the patients History and Physical, chart, labs and discussed the procedure including the risks, benefits and alternatives for the proposed anesthesia with the patient or authorized representative who has indicated his/her understanding and acceptance.     Plan Discussed with: CRNA and Surgeon  Anesthesia Plan Comments:         Anesthesia Quick Evaluation

## 2013-01-30 NOTE — Transfer of Care (Signed)
Immediate Anesthesia Transfer of Care Note  Patient: [name]  Procedure(s) Performed: Procedure(s) (LRB): LEFT LONG MUCOID CYST EXCISION DIP DEBRIDEMENT (Left)  Patient Location: PACU  Anesthesia Type: MAC  Level of Consciousness: awake, alert , oriented and patient cooperative  Airway & Oxygen Therapy: Patient Spontanous Breathing and Patient connected to face mask oxygen  Post-op Assessment: Report given to PACU RN and Post -op Vital signs reviewed and stable  Post vital signs: Reviewed and stable  Complications: No apparent anesthesia complications

## 2013-01-30 NOTE — Op Note (Signed)
[id number] 

## 2013-01-30 NOTE — Anesthesia Postprocedure Evaluation (Signed)
Anesthesia Post Note  Patient: [name]  Procedure(s) Performed: Procedure(s) (LRB): LEFT LONG MUCOID CYST EXCISION DIP DEBRIDEMENT (Left)  Anesthesia type: general  Patient location: PACU  Post pain: Pain level controlled  Post assessment: Patient's Cardiovascular Status Stable  Last Vitals:  Filed Vitals:   01/30/13 1130  BP: 120/71  Pulse: 73  Temp:   Resp: 16    Post vital signs: Reviewed and stable  Level of consciousness: sedated  Complications: No apparent anesthesia complications

## 2013-01-31 NOTE — Op Note (Signed)
[name], [name]                   ACCOUNT NO.:  [id number]  MEDICAL RECORD NO.:  [id number]  LOCATION:                                 FACILITY:  PHYSICIAN:  [name]. [name], M.D. DATE OF BIRTH:  08-Apr-1961  DATE OF PROCEDURE:  01/30/2013 DATE OF DISCHARGE:                              OPERATIVE REPORT   PREOPERATIVE DIAGNOSIS:  Mucoid cyst, dorsal ulnar aspect, left long finger with background significant osteoarthritis with ulnar deviation of left long finger distal interphalangeal joint.  POSTOPERATIVE DIAGNOSIS:  Mucoid cyst, dorsal ulnar aspect, left long finger with background significant osteoarthritis with ulnar deviation of left long finger distal interphalangeal joint with confirmation of advanced osteoarthritis of left long finger distal interphalangeal joint.  OPERATION:  Dorsal ulnar and dorsal radial debridement of left long finger DIP joint with capsulectomy, synovectomy, and exploration for loose bodies followed by irrigation of DIP joint.  OPERATING SURGEON:  [name]. Patricia Wallman, MD  ASSISTANT:  Surgical technician.  ANESTHESIA:  Lidocaine 2%, metacarpal head level block of left long finger supplemented by IV sedation.  SUPERVISING ANESTHESIOLOGIST:  [name]. 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  She recently presented for evaluation and management of a large mucoid cyst on the dorsal ulnar aspect of her left long finger.  She was noted to have Heberden's and Bouchard's nodes consistent with osteoarthritis and ulnar deviation of her left long finger DIP joint.  We had detailed informed consent in my office, during which we explained clearly to her that we could not alter the natural history of her osteoarthritis which is primarily genetic.  We could, however, remove the mucoid cyst and prevent rupture and possible joint infection by DIP joint  debridement, capsulectomy, synovectomy, and looking for loose bodies driving the mucoid cyst process.  She understands that this typically is successful, however, when one has significant osteoarthritis of the DIP joints, the development of loose bodies in the future and future mucoid cyst is always a possibility.  She is fully aware of these issues, presents for debridement of her left long finger DIP joint at this time.  We met in the holding area and following the proper surgical site identification protocol, marked her left long finger.  She had detailed anesthesia and informed consent by Dr. Michelle Vance and accepted IV sedation and local anesthesia provided by myself for the surgery.  After informed consent, she was brought to the operating room at this time.  PROCEDURE:  [name] was brought to room 1 of the [facility] and placed in supine position on the operating table.  Under Dr. Deirdre Vance direct supervision, IV sedation was provided.  Following Betadine prep of the left long finger, 2% lidocaine was infiltrated at metacarpal head level to obtain a digital block.  The left hand and arm were then prepped with Betadine soap and solution, sterilely draped.  A pneumatic tourniquet was applied p.r.n. to the proximal brachium, however, our plan was to use a digital tourniquet at the proximal phalangeal level.  Following sterile draping, the long finger was exsanguinated  with a gauze wrap and a 0.5-inch Penrose drain was placed to the proximal phalangeal segment with a hemostat to obtain a digital tourniquet.  A routine surgical time-out was accomplished during which we noted an allergy to penicillin.  Procedure commenced with curvilinear incisions on the dorsal ulnar and dorsal radial aspect of the left long finger DIP joint.  The cyst was incorporated into the curvilinear incision on the dorsal ulnar side followed by careful circumferential dissection of the cyst  membrane and removal of the cyst with a portion of the capsule between the terminal extensor tendon slip and the ulnar collateral ligament.  The DIP joint was opened and synovectomy accomplished on the ulnar aspect, deep to the extensor tendon.  A microcurette was used at the base of distal phalanx to remove any loose bodies and other hyaline cartilage debris.  We then performed a capsulotomy on the dorsal radial aspect of the DIP joint and passed a Henner through and through in an effort to remove any loose bodies and complete a thorough synovectomy on the dorsal aspect of the left long finger DIP joint.  The wound was then irrigated thoroughly with 2% lidocaine.  The wound was inspected for hemostasis followed by repair of the skin with 4-0 Prolene intradermal suture.  The finger was dressed with Xeroflo sterile gauze and a Coban finger dressing.  There were no apparent complications.  [name] was awakened from sedation and transferred to the recovery room for observation of vital signs.  We will discharge her home with prescriptions for doxycycline 100 mg p.o. b.i.d. x4 days as a prophylactic antibiotic with the indication being joint entry and pain medication in the form of Percocet 5 mg 1 p.o. q.4-6 hours p.r.n. pain, 20 tablets without refill.     [name] [name], M.D.     RVS/MEDQ  D:  01/30/2013  T:  01/31/2013  Job:  [id number]

## 2013-07-30 ENCOUNTER — Ambulatory Visit (INDEPENDENT_AMBULATORY_CARE_PROVIDER_SITE_OTHER): Payer: Federal, State, Local not specified - PPO | Admitting: Gynecology

## 2013-07-30 ENCOUNTER — Encounter: Payer: Self-pay | Admitting: Gynecology

## 2013-07-30 DIAGNOSIS — N898 Other specified noninflammatory disorders of vagina: Secondary | ICD-10-CM

## 2013-07-30 DIAGNOSIS — N3281 Overactive bladder: Secondary | ICD-10-CM

## 2013-07-30 DIAGNOSIS — N318 Other neuromuscular dysfunction of bladder: Secondary | ICD-10-CM

## 2013-07-30 DIAGNOSIS — L293 Anogenital pruritus, unspecified: Secondary | ICD-10-CM

## 2013-07-30 LAB — WET PREP FOR TRICH, YEAST, CLUE
Clue Cells Wet Prep HPF POC: NONE SEEN
Trich, Wet Prep: NONE SEEN

## 2013-07-30 MED ORDER — FLUCONAZOLE 150 MG PO TABS: 150.0000 mg | ORAL_TABLET | Freq: Once | ORAL | Status: DC

## 2013-07-30 NOTE — Progress Notes (Signed)
Several day history of vaginal itching, discharge. No urinary symptoms. Was given samples of Enablex for her to return stability and said this really didn't help her and she had to much of a dry mouth.  Exam with Selena Batten assistant External BUS vagina with white discharge. Cervix normal. Uterus normal size, mobile nontender. Adnexa without masses or tenderness.  Assessment and plan: Exam and history consistent with yeast vulvovaginitis. Diflucan 150 mg x1 provided with 1 additional pill to have available as needed. 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 Patient wants trial of Mirbetriq. Side effect profile reviewed. No overt contraindications. 25 mg tablet x2 weeks samples given. Patient will call at the end of these samples to see how she's doing. Possibly increasing to the 50 mg discussed.

## 2013-07-30 NOTE — Patient Instructions (Signed)
Take Diflucan tablet. Repeat if needed. Start on the Myrbetriq. Call me at the end of the two-week samples to let me know how you're doing.

## 2013-07-31 ENCOUNTER — Encounter: Payer: Self-pay | Admitting: Gastroenterology

## 2013-08-07 ENCOUNTER — Other Ambulatory Visit: Payer: Self-pay

## 2013-08-11 ENCOUNTER — Telehealth: Payer: Self-pay | Admitting: *Deleted

## 2013-08-11 MED ORDER — MIRABEGRON ER 25 MG PO TB24: 25.0000 mg | ORAL_TABLET | Freq: Every day | ORAL | Status: DC

## 2013-08-11 NOTE — Telephone Encounter (Signed)
rx sent, left on pt voicemail.

## 2013-08-11 NOTE — Telephone Encounter (Signed)
Okay to refill #30 by mouth daily refill x11

## 2013-08-11 NOTE — Telephone Encounter (Signed)
Pt was given samples Mirbetriq 25 mg works good and would like a Rx for this. Please advise

## 2013-08-18 ENCOUNTER — Other Ambulatory Visit: Payer: Self-pay | Admitting: Gynecology

## 2013-08-18 NOTE — Telephone Encounter (Signed)
Just received the Rx on 07/30/13.

## 2013-09-02 ENCOUNTER — Telehealth: Payer: Self-pay | Admitting: *Deleted

## 2013-09-02 MED ORDER — TERCONAZOLE 0.8 % VA CREA: 1.0000 | TOPICAL_CREAM | Freq: Every day | VAGINAL | Status: DC

## 2013-09-02 NOTE — Telephone Encounter (Signed)
Left below on voicemail, rx sent

## 2013-09-02 NOTE — Telephone Encounter (Signed)
Pt was given Rx for yeast infection on 07/30/13, took diflucan and had refill given on 08/18/13. Pt said itching and discharge never cleared, she asked if another rx could be given? Please advise

## 2013-09-02 NOTE — Telephone Encounter (Signed)
She can try Terazol 3 day cream. If symptoms persist then she needs office visit

## 2013-09-18 ENCOUNTER — Ambulatory Visit (AMBULATORY_SURGERY_CENTER): Payer: Self-pay

## 2013-09-18 VITALS — Ht 61.5 in | Wt 140.2 lb

## 2013-09-18 DIAGNOSIS — Z1211 Encounter for screening for malignant neoplasm of colon: Secondary | ICD-10-CM

## 2013-09-18 MED ORDER — MOVIPREP 100 G PO SOLR: 1.0000 | Freq: Once | ORAL | Status: DC

## 2013-09-24 ENCOUNTER — Encounter: Payer: Self-pay | Admitting: Gastroenterology

## 2013-10-10 ENCOUNTER — Ambulatory Visit (AMBULATORY_SURGERY_CENTER): Payer: Federal, State, Local not specified - PPO | Admitting: Gastroenterology

## 2013-10-10 ENCOUNTER — Encounter: Payer: Self-pay | Admitting: Gastroenterology

## 2013-10-10 VITALS — BP 121/88 | HR 73 | Temp 97.0°F | Resp 14 | Ht 61.5 in | Wt 140.0 lb

## 2013-10-10 DIAGNOSIS — D126 Benign neoplasm of colon, unspecified: Secondary | ICD-10-CM

## 2013-10-10 DIAGNOSIS — Z1211 Encounter for screening for malignant neoplasm of colon: Secondary | ICD-10-CM

## 2013-10-10 MED ORDER — SODIUM CHLORIDE 0.9 % IV SOLN: 500.0000 mL | INTRAVENOUS | Status: DC

## 2013-10-10 NOTE — Progress Notes (Signed)
Patient did not experience any of the following events: a burn prior to discharge; a fall within the facility; wrong site/side/patient/procedure/implant event; or a hospital transfer or hospital admission upon discharge from the facility. (G8907)Patient did not have preoperative order for IV antibiotic SSI prophylaxis. ([id number]) ewm 

## 2013-10-10 NOTE — Progress Notes (Signed)
Called to room to assist during endoscopic procedure.  Patient ID and intended procedure confirmed with present staff. Received instructions for my participation in the procedure from the performing physician.  

## 2013-10-10 NOTE — Progress Notes (Signed)
Procedure ends, to recovery, report given and VSS. 

## 2013-10-10 NOTE — Progress Notes (Signed)
No egg or soy allergy. ewm No problems with past sedation. ewm

## 2013-10-10 NOTE — Op Note (Signed)
[facility]  [address] [place], [address]   COLONOSCOPY PROCEDURE REPORT  PATIENT: [name], Reach  MR#: [id number] BIRTHDATE: May 14, 1961 , 52  yrs. old GENDER: Female ENDOSCOPIST: Ladene Artist, MD, [facility] REFERRED FG:HWEX Avel Sensor, M.D. PROCEDURE DATE:  10/10/2013 PROCEDURE:   Colonoscopy with snare polypectomy First Screening Colonoscopy - Avg.  risk and is 50 yrs.  old or older Yes.  Prior Negative Screening - Now for repeat screening. N/A  History of Adenoma - Now for follow-up colonoscopy & has been > or = to 3 yrs.  N/A  Polyps Removed Today? Yes. ASA CLASS:   Class I INDICATIONS:average risk screening. MEDICATIONS: MAC sedation, administered by CRNA and propofol (Diprivan) 250mg  IV DESCRIPTION OF PROCEDURE:   After the risks benefits and alternatives of the procedure were thoroughly explained, informed consent was obtained.  A digital rectal exam revealed no abnormalities of the rectum.   The LB [id number]  endoscope was introduced through the anus and advanced to the cecum, which was identified by both the appendix and ileocecal valve. No adverse events experienced.   The quality of the prep was excellent, using MoviPrep  The instrument was then slowly withdrawn as the colon was fully examined.  COLON FINDINGS: A sessile polyp measuring 7 mm in size was found in the transverse colon.  A polypectomy was performed with a cold snare.  The resection was complete and the polyp tissue was completely retrieved.   The colon was otherwise normal.  There was no diverticulosis, inflammation, polyps or cancers unless previously stated.  Retroflexed views revealed no abnormalities. The time to cecum=2 minutes 44 seconds.  Withdrawal time=8 minutes 54 seconds.  The scope was withdrawn and the procedure completed.  COMPLICATIONS: There were no complications.  ENDOSCOPIC IMPRESSION: 1.   Sessile polyp measuring 7 mm in the transverse colon; polypectomy  performed with a cold snare 2.   The colon was otherwise normal  RECOMMENDATIONS: 1.  Await pathology results 2.  Repeat colonoscopy in 5 years if polyp adenomatous; otherwise 10 years  eSigned:  Ladene Artist, MD, [facility] 10/10/2013 11:39 AM

## 2013-10-10 NOTE — Patient Instructions (Signed)
YOU HAD AN ENDOSCOPIC PROCEDURE TODAY AT THE [facility]: Refer to the procedure report that was given to you for any specific questions about what was found during the examination.  If the procedure report does not answer your questions, please call your gastroenterologist to clarify.  If you requested that your care partner not be given the details of your procedure findings, then the procedure report has been included in a sealed envelope for you to review at your convenience later.  YOU SHOULD EXPECT: Some feelings of bloating in the abdomen. Passage of more gas than usual.  Walking can help get rid of the air that was put into your GI tract during the procedure and reduce the bloating. If you had a lower endoscopy (such as a colonoscopy or flexible sigmoidoscopy) you may notice spotting of blood in your stool or on the toilet paper. If you underwent a bowel prep for your procedure, then you may not have a normal bowel movement for a few days.  DIET: Your first meal following the procedure should be a light meal and then it is ok to progress to your normal diet.  A half-sandwich or bowl of soup is an example of a good first meal.  Heavy or fried foods are harder to digest and may make you feel nauseous or bloated.  Likewise meals heavy in dairy and vegetables can cause extra gas to form and this can also increase the bloating.  Drink plenty of fluids but you should avoid alcoholic beverages for 24 hours.  ACTIVITY: Your care partner should take you home directly after the procedure.  You should plan to take it easy, moving slowly for the rest of the day.  You can resume normal activity the day after the procedure however you should NOT DRIVE or use heavy machinery for 24 hours (because of the sedation medicines used during the test).    SYMPTOMS TO REPORT IMMEDIATELY: A gastroenterologist can be reached at any hour.  During normal business hours, 8:30 AM to 5:00 PM Monday through Friday,  call [phone].  After hours and on weekends, please call the GI answering service at [phone] who will take a message and have the physician on call contact you.   Following lower endoscopy (colonoscopy or flexible sigmoidoscopy):  Excessive amounts of blood in the stool  Significant tenderness or worsening of abdominal pains  Swelling of the abdomen that is new, acute  Fever of 100F or higher    FOLLOW UP: If any biopsies were taken you will be contacted by phone or by letter within the next 1-3 weeks.  Call your gastroenterologist if you have not heard about the biopsies in 3 weeks.  Our staff will call the home number listed on your records the next business day following your procedure to check on you and address any questions or concerns that you may have at that time regarding the information given to you following your procedure. This is a courtesy call and so if there is no answer at the home number and we have not heard from you through the emergency physician on call, we will assume that you have returned to your regular daily activities without incident.  SIGNATURES/CONFIDENTIALITY: You and/or your care partner have signed paperwork which will be entered into your electronic medical record.  These signatures attest to the fact that that the information above on your After Visit Summary has been reviewed and is understood.  Full responsibility of the confidentiality   this discharge information lies with you and/or your care-partner.   INFORMATION ON POLYPS GIVEN TO YOU TODAY 

## 2013-10-13 ENCOUNTER — Telehealth: Payer: Self-pay | Admitting: *Deleted

## 2013-10-13 NOTE — Telephone Encounter (Signed)
Message left

## 2013-10-16 ENCOUNTER — Encounter: Payer: Self-pay | Admitting: Gastroenterology

## 2013-12-18 ENCOUNTER — Other Ambulatory Visit (HOSPITAL_COMMUNITY)
Admission: RE | Admit: 2013-12-18 | Discharge: 2013-12-18 | Disposition: A | Discharge status: Home / Self Care | Payer: Federal, State, Local not specified - PPO | Source: Ambulatory Visit | Attending: Gynecology | Admitting: Gynecology

## 2013-12-18 ENCOUNTER — Encounter: Payer: Self-pay | Admitting: Gynecology

## 2013-12-18 ENCOUNTER — Ambulatory Visit (INDEPENDENT_AMBULATORY_CARE_PROVIDER_SITE_OTHER): Payer: Federal, State, Local not specified - PPO | Admitting: Gynecology

## 2013-12-18 VITALS — BP 116/74 | Ht 61.0 in | Wt 141.0 lb

## 2013-12-18 DIAGNOSIS — Z1151 Encounter for screening for human papillomavirus (HPV): Secondary | ICD-10-CM | POA: Insufficient documentation

## 2013-12-18 DIAGNOSIS — Z01419 Encounter for gynecological examination (general) (routine) without abnormal findings: Secondary | ICD-10-CM | POA: Insufficient documentation

## 2013-12-18 DIAGNOSIS — R87810 Cervical high risk human papillomavirus (HPV) DNA test positive: Secondary | ICD-10-CM

## 2013-12-18 LAB — CBC WITH DIFFERENTIAL/PLATELET
BASOS ABS: 0.1 10*3/uL (ref 0.0–0.1)
Basophils Relative: 1 % (ref 0–1)
EOS PCT: 4 % (ref 0–5)
Eosinophils Absolute: 0.3 10*3/uL (ref 0.0–0.7)
HEMATOCRIT: 40.4 % (ref 36.0–46.0)
Hemoglobin: 14 g/dL (ref 12.0–15.0)
LYMPHS PCT: 36 % (ref 12–46)
Lymphs Abs: 2.7 10*3/uL (ref 0.7–4.0)
MCH: 29.7 pg (ref 26.0–34.0)
MCHC: 34.7 g/dL (ref 30.0–36.0)
MCV: 85.6 fL (ref 78.0–100.0)
MONO ABS: 0.6 10*3/uL (ref 0.1–1.0)
Monocytes Relative: 8 % (ref 3–12)
Neutro Abs: 3.8 10*3/uL (ref 1.7–7.7)
Neutrophils Relative %: 51 % (ref 43–77)
Platelets: 251 10*3/uL (ref 150–400)
RBC: 4.72 MIL/uL (ref 3.87–5.11)
RDW: 14 % (ref 11.5–15.5)
WBC: 7.5 10*3/uL (ref 4.0–10.5)

## 2013-12-18 LAB — COMPREHENSIVE METABOLIC PANEL
ALT: 15 U/L (ref 0–35)
AST: 18 U/L (ref 0–37)
Albumin: 4.5 g/dL (ref 3.5–5.2)
Alkaline Phosphatase: 58 U/L (ref 39–117)
BUN: 14 mg/dL (ref 6–23)
CALCIUM: 9.4 mg/dL (ref 8.4–10.5)
CHLORIDE: 102 meq/L (ref 96–112)
CO2: 27 mEq/L (ref 19–32)
CREATININE: 0.76 mg/dL (ref 0.50–1.10)
GLUCOSE: 79 mg/dL (ref 70–99)
Potassium: 4.1 mEq/L (ref 3.5–5.3)
Sodium: 139 mEq/L (ref 135–145)
Total Bilirubin: 0.5 mg/dL (ref 0.2–1.2)
Total Protein: 7 g/dL (ref 6.0–8.3)

## 2013-12-18 LAB — LIPID PANEL
CHOL/HDL RATIO: 4.1 ratio
CHOLESTEROL: 182 mg/dL (ref 0–200)
HDL: 44 mg/dL (ref >39)
LDL Cholesterol: 105 mg/dL — ABNORMAL HIGH (ref 0–99)
TRIGLYCERIDES: 165 mg/dL — AB (ref <150)
VLDL: 33 mg/dL (ref 0–40)

## 2013-12-18 NOTE — Progress Notes (Signed)
[name] 1960-12-16 [id number]        53 y.o.  [id number] for annual exam.  Several issues discussed below.  Past medical history,surgical history, problem list, medications, allergies, family history and social history were all reviewed and documented in the EPIC chart.  ROS:  Performed and pertinent positives and negatives are included in the history, assessment and plan .  Exam: Kim assistant Filed Vitals:   12/18/13 0915  BP: 116/74  Height: 5\' 1"  (1.549 m)  Weight: 141 lb (63.957 kg)   General appearance  Normal Skin grossly normal Head/Neck normal with no cervical or supraclavicular adenopathy thyroid normal Lungs  clear Cardiac RR, without RMG Abdominal  soft, nontender, without masses, organomegaly or hernia Breasts  examined lying and sitting without masses, retractions, discharge or axillary adenopathy. Pelvic  Ext/BUS/vagina normal  Cervix normal. Pap/HPV done  Uterus anteverted, normal size, shape and contour, midline and mobile nontender   Adnexa  Without masses or tenderness    Anus and perineum  Normal   Rectovaginal  Normal sphincter tone without palpated masses or tenderness.    Assessment/Plan:  53 y.o. [id number] female for annual exam with regular menses, condom contraception.   1. Contraception. Continues with condoms. Have reviewed failure risk multiple times in the past. I again discussed with her. Availability of plan B. also reviewed. Patient's comfortable with her choice and will continue with this. 2. Continues with regular menses. No hot flushes night sweats or other symptoms. Notes sister still menstruating at age 42. Continue to monitor and report any abnormal bleeding. 3. Urge incontinence. Had discussed last year and prescribed Enablex but has not continued. Prefers observation at this time. Check urinalysis 4. Pap smear 11/2012 normal but positive high-risk HPV negative subtypes 16/18/45. History of LGSIL 2002 negative Pap smears intervening. Pap/HPV  done today. 5. Mammography 12/2012. Patient will schedule followup next month. Continue with annual mammography. SBE monthly reviewed. 6. Colonoscopy 2015. Repeat at their recommended interval. 7. Health maintenance.baseline CBC, metabolic panel lipid profile TSH urinalysis vitamin D ordered. Followup one year, sooner as needed.   Note: This document was prepared with digital dictation and possible smart phrase technology. Any transcriptional errors that result from this process are unintentional.   [name], 9:42 AM 12/18/2013

## 2013-12-18 NOTE — Patient Instructions (Signed)
Followup for annual exam in one year, sooner if any issues. Followup for your mammogram next month.  You may obtain a copy of any labs that were done today by logging onto MyChart as outlined in the instructions provided with your AVS (after visit summary). The office will not call with normal lab results but certainly if there are any significant abnormalities then we will contact you.   Health Maintenance, Female A healthy lifestyle and preventative care can promote health and wellness.  Maintain regular health, dental, and eye exams.  Eat a healthy diet. Foods like vegetables, fruits, whole grains, low-fat dairy products, and lean protein foods contain the nutrients you need without too many calories. Decrease your intake of foods high in solid fats, added sugars, and salt. Get information about a proper diet from your caregiver, if necessary.  Regular physical exercise is one of the most important things you can do for your health. Most adults should get at least 150 minutes of moderate-intensity exercise (any activity that increases your heart rate and causes you to sweat) each week. In addition, most adults need muscle-strengthening exercises on 2 or more days a week.   Maintain a healthy weight. The body mass index (BMI) is a screening tool to identify possible weight problems. It provides an estimate of body fat based on height and weight. Your caregiver can help determine your BMI, and can help you achieve or maintain a healthy weight. For adults 20 years and older:  A BMI below 18.5 is considered underweight.  A BMI of 18.5 to 24.9 is normal.  A BMI of 25 to 29.9 is considered overweight.  A BMI of 30 and above is considered obese.  Maintain normal blood lipids and cholesterol by exercising and minimizing your intake of saturated fat. Eat a balanced diet with plenty of fruits and vegetables. Blood tests for lipids and cholesterol should begin at age 68 and be repeated every 5  years. If your lipid or cholesterol levels are high, you are over 50, or you are a high risk for heart disease, you may need your cholesterol levels checked more frequently.Ongoing high lipid and cholesterol levels should be treated with medicines if diet and exercise are not effective.  If you smoke, find out from your caregiver how to quit. If you do not use tobacco, do not start.  Lung cancer screening is recommended for adults aged 67 80 years who are at high risk for developing lung cancer because of a history of smoking. Yearly low-dose computed tomography (CT) is recommended for people who have at least a 30-pack-year history of smoking and are a current smoker or have quit within the past 15 years. A pack year of smoking is smoking an average of 1 pack of cigarettes a day for 1 year (for example: 1 pack a day for 30 years or 2 packs a day for 15 years). Yearly screening should continue until the smoker has stopped smoking for at least 15 years. Yearly screening should also be stopped for people who develop a health problem that would prevent them from having lung cancer treatment.  If you are pregnant, do not drink alcohol. If you are breastfeeding, be very cautious about drinking alcohol. If you are not pregnant and choose to drink alcohol, do not exceed 1 drink per day. One drink is considered to be 12 ounces (355 mL) of beer, 5 ounces (148 mL) of wine, or 1.5 ounces (44 mL) of liquor.  Avoid use of street  drugs. Do not share needles with anyone. Ask for help if you need support or instructions about stopping the use of drugs.  High blood pressure causes heart disease and increases the risk of stroke. Blood pressure should be checked at least every 1 to 2 years. Ongoing high blood pressure should be treated with medicines, if weight loss and exercise are not effective.  If you are 59 to 52 years old, ask your caregiver if you should take aspirin to prevent strokes.  Diabetes screening  involves taking a blood sample to check your fasting blood sugar level. This should be done once every 3 years, after age 75, if you are within normal weight and without risk factors for diabetes. Testing should be considered at a younger age or be carried out more frequently if you are overweight and have at least 1 risk factor for diabetes.  Breast cancer screening is essential preventative care for women. You should practice "breast self-awareness." This means understanding the normal appearance and feel of your breasts and may include breast self-examination. Any changes detected, no matter how small, should be reported to a caregiver. Women in their 45s and 30s should have a clinical breast exam (CBE) by a caregiver as part of a regular health exam every 1 to 3 years. After age 25, women should have a CBE every year. Starting at age 75, women should consider having a mammogram (breast X-ray) every year. Women who have a family history of breast cancer should talk to their caregiver about genetic screening. Women at a high risk of breast cancer should talk to their caregiver about having an MRI and a mammogram every year.  Breast cancer gene (BRCA)-related cancer risk assessment is recommended for women who have family members with BRCA-related cancers. BRCA-related cancers include breast, ovarian, tubal, and peritoneal cancers. Having family members with these cancers may be associated with an increased risk for harmful changes (mutations) in the breast cancer genes BRCA1 and BRCA2. Results of the assessment will determine the need for genetic counseling and BRCA1 and BRCA2 testing.  The Pap test is a screening test for cervical cancer. Women should have a Pap test starting at age 96. Between ages 37 and 12, Pap tests should be repeated every 2 years. Beginning at age 61, you should have a Pap test every 3 years as long as the past 3 Pap tests have been normal. If you had a hysterectomy for a problem that  was not cancer or a condition that could lead to cancer, then you no longer need Pap tests. If you are between ages 80 and 93, and you have had normal Pap tests going back 10 years, you no longer need Pap tests. If you have had past treatment for cervical cancer or a condition that could lead to cancer, you need Pap tests and screening for cancer for at least 20 years after your treatment. If Pap tests have been discontinued, risk factors (such as a new sexual partner) need to be reassessed to determine if screening should be resumed. Some women have medical problems that increase the chance of getting cervical cancer. In these cases, your caregiver may recommend more frequent screening and Pap tests.  The human papillomavirus (HPV) test is an additional test that may be used for cervical cancer screening. The HPV test looks for the virus that can cause the cell changes on the cervix. The cells collected during the Pap test can be tested for HPV. The HPV test could be  used to screen women aged 10 years and older, and should be used in women of any age who have unclear Pap test results. After the age of 107, women should have HPV testing at the same frequency as a Pap test.  Colorectal cancer can be detected and often prevented. Most routine colorectal cancer screening begins at the age of 43 and continues through age 60. However, your caregiver may recommend screening at an earlier age if you have risk factors for colon cancer. On a yearly basis, your caregiver may provide home test kits to check for hidden blood in the stool. Use of a small camera at the end of a tube, to directly examine the colon (sigmoidoscopy or colonoscopy), can detect the earliest forms of colorectal cancer. Talk to your caregiver about this at age 41, when routine screening begins. Direct examination of the colon should be repeated every 5 to 10 years through age 13, unless early forms of pre-cancerous polyps or small growths are  found.  Hepatitis C blood testing is recommended for all people born from [birth year] through 1964 and any individual with known risks for hepatitis C.  Practice safe sex. Use condoms and avoid high-risk sexual practices to reduce the spread of sexually transmitted infections (STIs). Sexually active women aged 79 and younger should be checked for Chlamydia, which is a common sexually transmitted infection. Older women with new or multiple partners should also be tested for Chlamydia. Testing for other STIs is recommended if you are sexually active and at increased risk.  Osteoporosis is a disease in which the bones lose minerals and strength with aging. This can result in serious bone fractures. The risk of osteoporosis can be identified using a bone density scan. Women ages 58 and over and women at risk for fractures or osteoporosis should discuss screening with their caregivers. Ask your caregiver whether you should be taking a calcium supplement or vitamin D to reduce the rate of osteoporosis.  Menopause can be associated with physical symptoms and risks. Hormone replacement therapy is available to decrease symptoms and risks. You should talk to your caregiver about whether hormone replacement therapy is right for you.  Use sunscreen. Apply sunscreen liberally and repeatedly throughout the day. You should seek shade when your shadow is shorter than you. Protect yourself by wearing long sleeves, pants, a wide-brimmed hat, and sunglasses year round, whenever you are outdoors.  Notify your caregiver of new moles or changes in moles, especially if there is a change in shape or color. Also notify your caregiver if a mole is larger than the size of a pencil eraser.  Stay current with your immunizations. Document Released: 04/03/2011 Document Revised: 01/13/2013 Document Reviewed: 04/03/2011 [facility] Patient Information 2014 [place].

## 2013-12-18 NOTE — Addendum Note (Signed)
Addended by: Nelva Nay on: 12/18/2013 10:26 AM   Modules accepted: Orders

## 2013-12-19 ENCOUNTER — Encounter: Payer: Self-pay | Admitting: Gynecology

## 2013-12-19 LAB — URINALYSIS W MICROSCOPIC + REFLEX CULTURE
BILIRUBIN URINE: NEGATIVE
CASTS: NONE SEEN
Crystals: NONE SEEN
GLUCOSE, UA: NEGATIVE mg/dL
KETONES UR: NEGATIVE mg/dL
Leukocytes, UA: NEGATIVE
Nitrite: NEGATIVE
PROTEIN: NEGATIVE mg/dL
Specific Gravity, Urine: 1.013 (ref 1.005–1.030)
Urobilinogen, UA: 0.2 mg/dL (ref 0.0–1.0)
pH: 7.5 (ref 5.0–8.0)

## 2013-12-19 LAB — TSH: TSH: 2.352 u[IU]/mL (ref 0.350–4.500)

## 2013-12-19 LAB — VITAMIN D 25 HYDROXY (VIT D DEFICIENCY, FRACTURES): VIT D 25 HYDROXY: 41 ng/mL (ref 30–89)

## 2014-01-14 ENCOUNTER — Other Ambulatory Visit: Payer: Self-pay | Admitting: Gynecology

## 2014-01-14 DIAGNOSIS — Z1231 Encounter for screening mammogram for malignant neoplasm of breast: Secondary | ICD-10-CM

## 2014-01-28 ENCOUNTER — Ambulatory Visit (HOSPITAL_COMMUNITY): Payer: Federal, State, Local not specified - PPO

## 2014-02-16 ENCOUNTER — Ambulatory Visit (HOSPITAL_COMMUNITY)
Admission: RE | Admit: 2014-02-16 | Discharge: 2014-02-16 | Disposition: A | Discharge status: Home / Self Care | Payer: Federal, State, Local not specified - PPO | Source: Ambulatory Visit | Attending: Gynecology | Admitting: Gynecology

## 2014-02-16 DIAGNOSIS — Z1231 Encounter for screening mammogram for malignant neoplasm of breast: Secondary | ICD-10-CM | POA: Insufficient documentation

## 2014-06-23 ENCOUNTER — Ambulatory Visit (INDEPENDENT_AMBULATORY_CARE_PROVIDER_SITE_OTHER): Payer: Federal, State, Local not specified - PPO | Admitting: Gynecology

## 2014-06-23 ENCOUNTER — Encounter: Payer: Self-pay | Admitting: Gynecology

## 2014-06-23 DIAGNOSIS — R102 Pelvic and perineal pain: Secondary | ICD-10-CM

## 2014-06-23 DIAGNOSIS — N949 Unspecified condition associated with female genital organs and menstrual cycle: Secondary | ICD-10-CM

## 2014-06-23 DIAGNOSIS — N898 Other specified noninflammatory disorders of vagina: Secondary | ICD-10-CM

## 2014-06-23 LAB — URINALYSIS W MICROSCOPIC + REFLEX CULTURE
BILIRUBIN URINE: NEGATIVE
CASTS: NONE SEEN
CRYSTALS: NONE SEEN
Glucose, UA: NEGATIVE mg/dL
Ketones, ur: NEGATIVE mg/dL
LEUKOCYTES UA: NEGATIVE
Nitrite: NEGATIVE
PROTEIN: NEGATIVE mg/dL
Specific Gravity, Urine: 1.01 (ref 1.005–1.030)
Urobilinogen, UA: 0.2 mg/dL (ref 0.0–1.0)
pH: 7 (ref 5.0–8.0)

## 2014-06-23 MED ORDER — FLUCONAZOLE 150 MG PO TABS: 150.0000 mg | ORAL_TABLET | Freq: Once | ORAL | Status: DC

## 2014-06-23 MED ORDER — SULFAMETHOXAZOLE-TRIMETHOPRIM 800-160 MG PO TABS: 1.0000 | ORAL_TABLET | Freq: Two times a day (BID) | ORAL | Status: DC

## 2014-06-23 NOTE — Patient Instructions (Addendum)
Take the one Diflucan pill. Take the Septra antibiotics twice daily for 3 days. Follow up if your symptoms persist, worsen or recur.

## 2014-06-23 NOTE — Progress Notes (Addendum)
[name] 16-Aug-1961 [id number]        53 y.o.  [id number] presents with several weeks of pelvic discomfort and vaginal discharge. Itching or odor. No frequency dysuria low back pain or fever/chills. LMP 06/19/2014 and is at the tail and now.  Past medical history,surgical history, problem list, medications, allergies, family history and social history were all reviewed and documented in the EPIC chart.  Directed ROS with pertinent positives and negatives documented in the history of present illness/assessment and plan.  Exam: [name] General appearance:  Normal Spine straight without CVA tenderness Abdomen soft nontender without masses guarding rebound organomegaly Pelvic external BUS vagina with light menses flow. Cervix normal. Uterus normal size midline mobile nontender. Adnexa without masses or tenderness.  Assessment/Plan:  53 y.o. [name] with symptoms as above. Urinalysis does show TNTC RBCs and few bacteria. Suspect vaginal contamination. We'll cover for UTI with Septra DS one by mouth twice a day x3 days. Given her history also of a white discharge preceding her menses will cover for yeast with Diflucan 150 mg x1 dose. Asked the patient to return after she stopped bleeding to repeat clean-catch urinalysis to make sure that this clears and she agrees to do so. Follow up if her symptoms persist, worsen or recur.     [name], 3:48 PM 06/23/2014

## 2014-06-24 LAB — URINE CULTURE
Colony Count: NO GROWTH
ORGANISM ID, BACTERIA: NO GROWTH

## 2014-06-25 ENCOUNTER — Other Ambulatory Visit: Payer: Self-pay | Admitting: *Deleted

## 2014-06-25 DIAGNOSIS — N939 Abnormal uterine and vaginal bleeding, unspecified: Secondary | ICD-10-CM

## 2014-06-29 ENCOUNTER — Other Ambulatory Visit: Payer: Federal, State, Local not specified - PPO

## 2014-06-29 DIAGNOSIS — N939 Abnormal uterine and vaginal bleeding, unspecified: Secondary | ICD-10-CM

## 2014-06-30 LAB — URINALYSIS W MICROSCOPIC + REFLEX CULTURE
Bacteria, UA: NONE SEEN
Bilirubin Urine: NEGATIVE
Casts: NONE SEEN
Crystals: NONE SEEN
Glucose, UA: NEGATIVE mg/dL
Hgb urine dipstick: NEGATIVE
Ketones, ur: NEGATIVE mg/dL
LEUKOCYTES UA: NEGATIVE
NITRITE: NEGATIVE
Protein, ur: NEGATIVE mg/dL
SPECIFIC GRAVITY, URINE: 1.01 (ref 1.005–1.030)
SQUAMOUS EPITHELIAL / LPF: NONE SEEN
Urobilinogen, UA: 0.2 mg/dL (ref 0.0–1.0)
pH: 7.5 (ref 5.0–8.0)

## 2014-07-03 ENCOUNTER — Telehealth: Payer: Self-pay | Admitting: *Deleted

## 2014-07-03 MED ORDER — NITROFURANTOIN MONOHYD MACRO 100 MG PO CAPS: 100.0000 mg | ORAL_CAPSULE | Freq: Two times a day (BID) | ORAL | Status: AC

## 2014-07-03 NOTE — Telephone Encounter (Signed)
Recommend Macrobid 100 mg twice a day x7 days.  Office visit if symptoms persist

## 2014-07-03 NOTE — Telephone Encounter (Signed)
Pt informed with the below note, rx sent. 

## 2014-07-03 NOTE — Telephone Encounter (Signed)
Pt was seen on OV 06/23/14 c/o pelvic pressure and doesn't feel the UTI is fully gone. Was treated with septra DS x 3 days, pt asked if refill could be sent? Please advise

## 2014-08-03 ENCOUNTER — Encounter: Payer: Self-pay | Admitting: Gynecology

## 2014-08-05 IMAGING — MG MM DIGITAL SCREENING BILAT
4 series · 4 of 4 positions shown · non-contrast
Comparison: Previous exams.

CLINICAL DATA: Screening.

DIGITAL BILATERAL SCREENING MAMMOGRAM WITH CAD

[R CC]
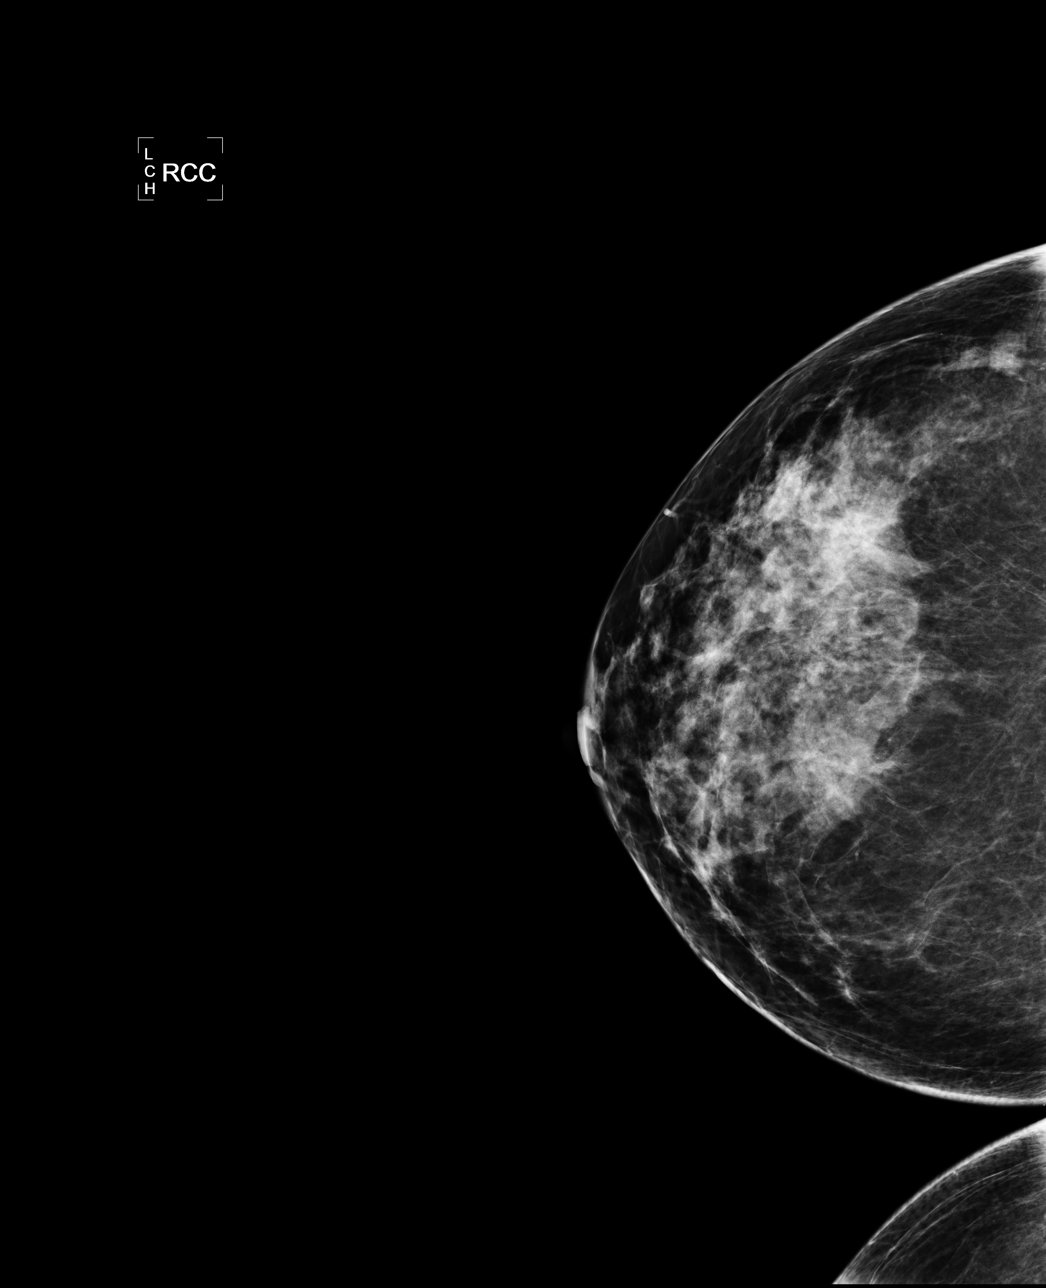

[R MLO]
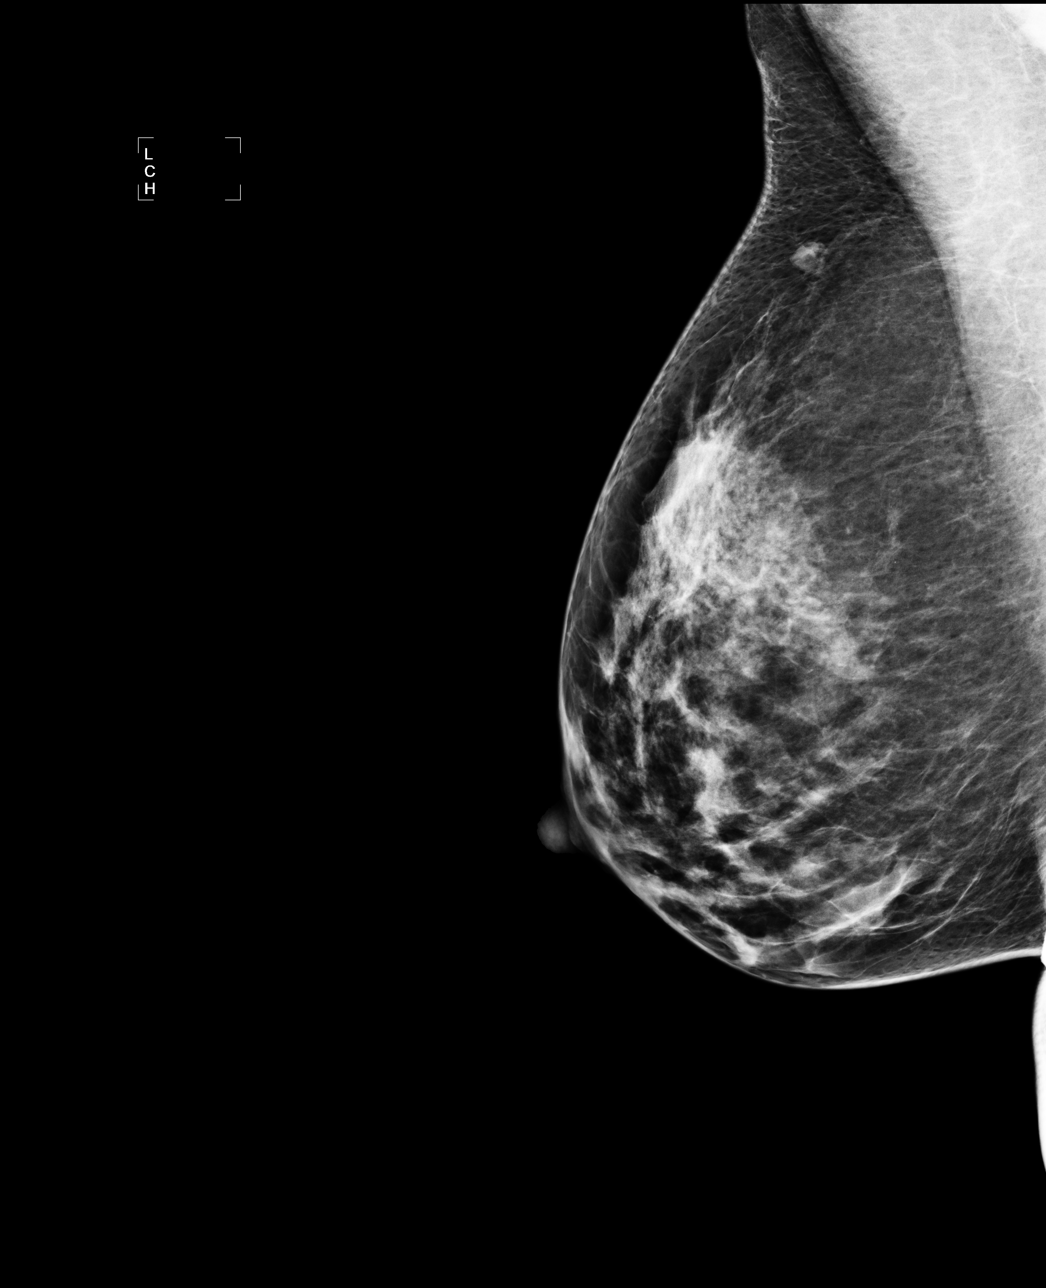

[L CC]
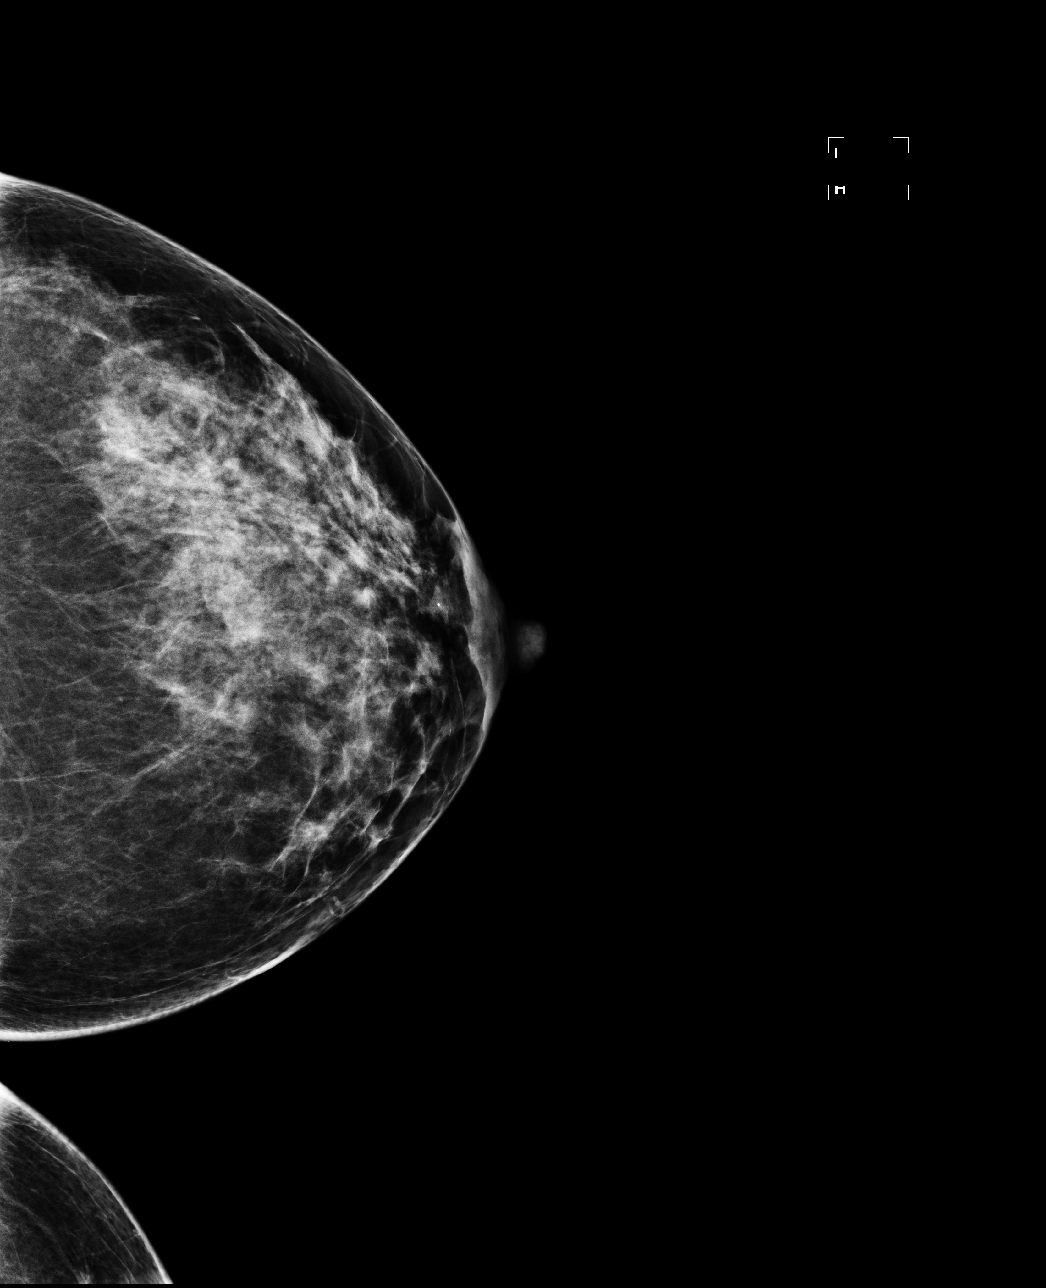

[L MLO]
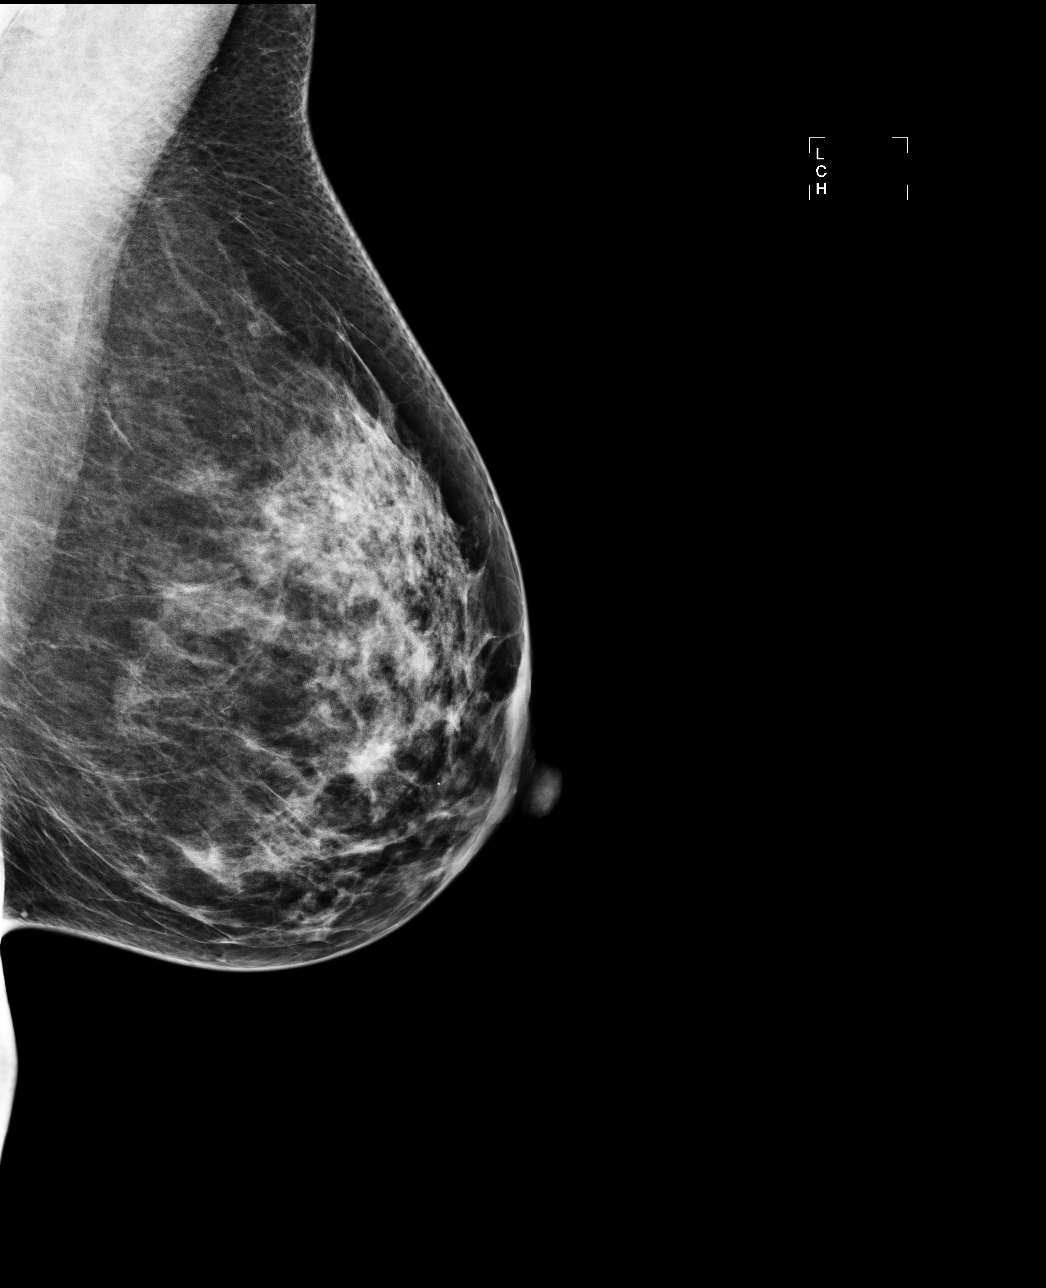

[4 of 4 positions shown; findings below may reference images not displayed]

FINDINGS: ACR Breast Density Category 3: The breast tissue is heterogeneously
dense.

No suspicious masses, architectural distortion, or calcifications
are present.

Images were processed with CAD.
IMPRESSION: No mammographic evidence of malignancy.

A result letter of this screening mammogram will be mailed directly
to the patient.

RECOMMENDATION:
Screening mammogram in one year. (Code:S5-2-VZT)

BI-RADS CATEGORY 1:  Negative.

## 2015-01-29 ENCOUNTER — Other Ambulatory Visit: Payer: Self-pay | Admitting: Gynecology

## 2015-01-29 DIAGNOSIS — Z1231 Encounter for screening mammogram for malignant neoplasm of breast: Secondary | ICD-10-CM

## 2015-02-18 ENCOUNTER — Ambulatory Visit (HOSPITAL_COMMUNITY)
Admission: RE | Admit: 2015-02-18 | Discharge: 2015-02-18 | Disposition: A | Discharge status: Home / Self Care | Payer: Federal, State, Local not specified - PPO | Source: Ambulatory Visit | Attending: Gynecology | Admitting: Gynecology

## 2015-02-18 DIAGNOSIS — Z1231 Encounter for screening mammogram for malignant neoplasm of breast: Secondary | ICD-10-CM | POA: Diagnosis not present

## 2016-01-31 ENCOUNTER — Other Ambulatory Visit: Payer: Self-pay

## 2016-01-31 DIAGNOSIS — Z1231 Encounter for screening mammogram for malignant neoplasm of breast: Secondary | ICD-10-CM

## 2016-02-29 ENCOUNTER — Ambulatory Visit
Admission: RE | Admit: 2016-02-29 | Discharge: 2016-02-29 | Disposition: A | Discharge status: Home / Self Care | Payer: Federal, State, Local not specified - PPO | Source: Ambulatory Visit

## 2016-02-29 DIAGNOSIS — Z1231 Encounter for screening mammogram for malignant neoplasm of breast: Secondary | ICD-10-CM

## 2016-03-09 ENCOUNTER — Ambulatory Visit (INDEPENDENT_AMBULATORY_CARE_PROVIDER_SITE_OTHER): Payer: Federal, State, Local not specified - PPO | Admitting: Gynecology

## 2016-03-09 ENCOUNTER — Encounter: Payer: Self-pay | Admitting: Gynecology

## 2016-03-09 VITALS — BP 124/74 | Ht 61.5 in | Wt 144.0 lb

## 2016-03-09 DIAGNOSIS — Z13220 Encounter for screening for lipoid disorders: Secondary | ICD-10-CM

## 2016-03-09 DIAGNOSIS — Z01419 Encounter for gynecological examination (general) (routine) without abnormal findings: Secondary | ICD-10-CM

## 2016-03-09 DIAGNOSIS — N926 Irregular menstruation, unspecified: Secondary | ICD-10-CM

## 2016-03-09 DIAGNOSIS — Z1329 Encounter for screening for other suspected endocrine disorder: Secondary | ICD-10-CM | POA: Diagnosis not present

## 2016-03-09 DIAGNOSIS — L918 Other hypertrophic disorders of the skin: Secondary | ICD-10-CM | POA: Diagnosis not present

## 2016-03-09 LAB — COMPREHENSIVE METABOLIC PANEL
ALBUMIN: 4.3 g/dL (ref 3.6–5.1)
ALT: 14 U/L (ref 6–29)
AST: 18 U/L (ref 10–35)
Alkaline Phosphatase: 70 U/L (ref 33–130)
BILIRUBIN TOTAL: 0.3 mg/dL (ref 0.2–1.2)
BUN: 9 mg/dL (ref 7–25)
CHLORIDE: 101 mmol/L (ref 98–110)
CO2: 26 mmol/L (ref 20–31)
CREATININE: 0.65 mg/dL (ref 0.50–1.05)
Calcium: 9.1 mg/dL (ref 8.6–10.4)
Glucose, Bld: 83 mg/dL (ref 65–99)
Potassium: 4.1 mmol/L (ref 3.5–5.3)
SODIUM: 137 mmol/L (ref 135–146)
TOTAL PROTEIN: 7.2 g/dL (ref 6.1–8.1)

## 2016-03-09 LAB — CBC WITH DIFFERENTIAL/PLATELET
BASOS PCT: 0 %
Basophils Absolute: 0 cells/uL (ref 0–200)
EOS ABS: 276 {cells}/uL (ref 15–500)
EOS PCT: 3 %
HCT: 42.4 % (ref 35.0–45.0)
HEMOGLOBIN: 14.5 g/dL (ref 11.7–15.5)
LYMPHS PCT: 33 %
Lymphs Abs: 3036 cells/uL (ref 850–3900)
MCH: 30.3 pg (ref 27.0–33.0)
MCHC: 34.2 g/dL (ref 32.0–36.0)
MCV: 88.7 fL (ref 80.0–100.0)
MONO ABS: 828 {cells}/uL (ref 200–950)
MPV: 9.9 fL (ref 7.5–12.5)
Monocytes Relative: 9 %
NEUTROS PCT: 55 %
Neutro Abs: 5060 cells/uL (ref 1500–7800)
PLATELETS: 248 10*3/uL (ref 140–400)
RBC: 4.78 MIL/uL (ref 3.80–5.10)
RDW: 13.8 % (ref 11.0–15.0)
WBC: 9.2 10*3/uL (ref 3.8–10.8)

## 2016-03-09 LAB — TSH: TSH: 3.35 mIU/L

## 2016-03-09 LAB — LIPID PANEL
CHOLESTEROL: 194 mg/dL (ref 125–200)
HDL: 47 mg/dL (ref >=46)
LDL Cholesterol: 91 mg/dL (ref <130)
TRIGLYCERIDES: 281 mg/dL — AB (ref <150)
Total CHOL/HDL Ratio: 4.1 Ratio (ref <=5.0)
VLDL: 56 mg/dL — AB (ref <30)

## 2016-03-09 NOTE — Patient Instructions (Signed)

## 2016-03-09 NOTE — Progress Notes (Signed)
    [name] 1961/08/20 [id number]        55 y.o.  [id number]  for annual exam.  Several issues noted below  Past medical history,surgical history, problem list, medications, allergies, family history and social history were all reviewed and documented as reviewed in the EPIC chart.  ROS:  Performed with pertinent positives and negatives included in the history, assessment and plan.   Additional significant findings :  None   Exam: [name] assistant Filed Vitals:   03/09/16 1404  BP: 124/74  Height: 5' 1.5" (1.562 m)  Weight: 144 lb (65.318 kg)   General appearance:  Normal affect, orientation and appearance. Skin: Grossly normal HEENT: Without gross lesions.  No cervical or supraclavicular adenopathy. Thyroid normal.  Lungs:  Clear without wheezing, rales or rhonchi Cardiac: RR, without RMG Abdominal:  Soft, nontender, without masses, guarding, rebound, organomegaly or hernia Breasts:  Examined lying and sitting without masses, retractions, discharge or axillary adenopathy. Pelvic:  Ext/BUS/vagina Normal. small classic appearing skin tag crease of right thigh and mons pubis  Cervix normal. Pap smear/HPV done  Uterus anteverted, normal size, shape and contour, midline and mobile nontender   Adnexa without masses or tenderness    Anus and perineum normal   Rectovaginal normal sphincter tone without palpated masses or tenderness.   Procedure: Skin overlying the small skin tag was cleansed with Betadine, infiltrated with 1% lidocaine and the skin tag was excised at the junction with the normal surrounding skin. Several nitrate hemostasis applied. Sterile dressing applied. Discussed whether to send to pathology or not. Given its classic benign appearance the patient is comfortable with discarding it which we did.   Assessment/Plan:  55 y.o. [id number] female for annual exam with irregular menses, abstinent contraception.   1. Irregular menses. Patient notes of the past year she is  starting to skip menses. She is not having significant hot flushes or sweats. We'll keep a menstrual calendar over this coming year and as long as less frequent but regular menses when they occur will monitor. Prolonged or atypical bleeding she knows to follow up for further evaluation. If significant menopausal symptoms develop and she wants to discuss possible HRT she'll follow up for that discussion. 2. Skin tag as described above. It's bothersome to the patient as it is catching on her underwear and she wants it removed. Very classic in appearance and we discarded it to save the cost of pathology. 3. Contraception. Husband having issues with erectile dysfunction and she is not sexually active. Knows the need for contraception if she becomes sexually active. 4. Pap smear/HPV 2015. History of positive high-risk HPV negative subtype 16/18/45 and 2014. Negative follow up Pap smear/HPV 2015. Pap smear/HPV done today. History of LGSIL 2002. Mammography 01/2016. Continue with annual mammography when due. SBE monthly reviewed. 5. Colonoscopy 2015. Repeat at their recommended interval. 6. Health maintenance. Baseline CBC, CMP, lipid profile, TSH, urinalysis ordered. Follow up in one year, sooner as needed.    [name], 2:32 PM 03/09/2016

## 2016-03-09 NOTE — Addendum Note (Signed)
Addended by: Nelva Nay on: 03/09/2016 02:48 PM   Modules accepted: Orders, SmartSet

## 2016-03-10 LAB — URINALYSIS W MICROSCOPIC + REFLEX CULTURE
Bacteria, UA: NONE SEEN [HPF]
Bilirubin Urine: NEGATIVE
CASTS: NONE SEEN [LPF]
Crystals: NONE SEEN [HPF]
GLUCOSE, UA: NEGATIVE
HGB URINE DIPSTICK: NEGATIVE
KETONES UR: NEGATIVE
NITRITE: NEGATIVE
PH: 7 (ref 5.0–8.0)
Protein, ur: NEGATIVE
SPECIFIC GRAVITY, URINE: 1.008 (ref 1.001–1.035)
SQUAMOUS EPITHELIAL / LPF: NONE SEEN [HPF] (ref <=5)
WBC UA: NONE SEEN WBC/HPF (ref <=5)
Yeast: NONE SEEN [HPF]

## 2016-03-11 LAB — URINE CULTURE: Colony Count: 50000

## 2016-03-13 ENCOUNTER — Other Ambulatory Visit: Payer: Self-pay | Admitting: Gynecology

## 2016-03-13 DIAGNOSIS — E781 Pure hyperglyceridemia: Secondary | ICD-10-CM

## 2016-03-13 LAB — PAP IG AND HPV HIGH-RISK: HPV DNA High Risk: NOT DETECTED

## 2016-04-05 DIAGNOSIS — K080 Exfoliation of teeth due to systemic causes: Secondary | ICD-10-CM | POA: Diagnosis not present

## 2016-06-17 DIAGNOSIS — S335XXA Sprain of ligaments of lumbar spine, initial encounter: Secondary | ICD-10-CM | POA: Diagnosis not present

## 2016-06-17 DIAGNOSIS — S134XXA Sprain of ligaments of cervical spine, initial encounter: Secondary | ICD-10-CM | POA: Diagnosis not present

## 2016-07-04 DIAGNOSIS — S139XXA Sprain of joints and ligaments of unspecified parts of neck, initial encounter: Secondary | ICD-10-CM | POA: Diagnosis not present

## 2016-09-05 DIAGNOSIS — J029 Acute pharyngitis, unspecified: Secondary | ICD-10-CM | POA: Diagnosis not present

## 2016-09-05 DIAGNOSIS — R509 Fever, unspecified: Secondary | ICD-10-CM | POA: Diagnosis not present

## 2016-10-11 DIAGNOSIS — D485 Neoplasm of uncertain behavior of skin: Secondary | ICD-10-CM | POA: Diagnosis not present

## 2016-10-11 DIAGNOSIS — B078 Other viral warts: Secondary | ICD-10-CM | POA: Diagnosis not present

## 2016-10-16 DIAGNOSIS — K080 Exfoliation of teeth due to systemic causes: Secondary | ICD-10-CM | POA: Diagnosis not present

## 2017-02-20 ENCOUNTER — Other Ambulatory Visit: Payer: Self-pay | Admitting: Gynecology

## 2017-02-20 DIAGNOSIS — Z1231 Encounter for screening mammogram for malignant neoplasm of breast: Secondary | ICD-10-CM

## 2017-03-12 ENCOUNTER — Encounter: Payer: Federal, State, Local not specified - PPO | Admitting: Gynecology

## 2017-03-14 ENCOUNTER — Ambulatory Visit: Payer: Federal, State, Local not specified - PPO

## 2017-04-05 ENCOUNTER — Encounter: Payer: Self-pay | Admitting: Gynecology

## 2017-04-05 ENCOUNTER — Ambulatory Visit (INDEPENDENT_AMBULATORY_CARE_PROVIDER_SITE_OTHER): Payer: Federal, State, Local not specified - PPO | Admitting: Gynecology

## 2017-04-05 VITALS — BP 122/76 | Ht 61.0 in | Wt 146.0 lb

## 2017-04-05 DIAGNOSIS — N952 Postmenopausal atrophic vaginitis: Secondary | ICD-10-CM | POA: Diagnosis not present

## 2017-04-05 DIAGNOSIS — Z01411 Encounter for gynecological examination (general) (routine) with abnormal findings: Secondary | ICD-10-CM | POA: Diagnosis not present

## 2017-04-05 DIAGNOSIS — N943 Premenstrual tension syndrome: Secondary | ICD-10-CM | POA: Diagnosis not present

## 2017-04-05 MED ORDER — FLUOXETINE HCL 20 MG PO CAPS: 20.0000 mg | ORAL_CAPSULE | Freq: Every day | ORAL | 11 refills | Status: DC

## 2017-04-05 NOTE — Progress Notes (Signed)
    [name] 1961-05-15 [id number]        56 y.o.  [id number] for annual exam.  Patient also complaining of PMS type symptoms with anxiety, mood tension as well as breast tenderness and bloating. Menses continue irregular this past year we she will skip several months and then have several months that her Road with regular menses. No prolonged or atypical bleeding.  Past medical history,surgical history, problem list, medications, allergies, family history and social history were all reviewed and documented as reviewed in the EPIC chart.  ROS:  Performed with pertinent positives and negatives included in the history, assessment and plan.   Additional significant findings :  None   Exam: [name] assistant Vitals:   04/05/17 1024  BP: 122/76  Weight: 146 lb (66.2 kg)  Height: 5\' 1"  (1.549 m)   Body mass index is 27.59 kg/m.  General appearance:  Normal affect, orientation and appearance. Skin: Grossly normal HEENT: Without gross lesions.  No cervical or supraclavicular adenopathy. Thyroid normal.  Lungs:  Clear without wheezing, rales or rhonchi Cardiac: RR, without RMG Abdominal:  Soft, nontender, without masses, guarding, rebound, organomegaly or hernia Breasts:  Examined lying and sitting without masses, retractions, discharge or axillary adenopathy. Pelvic:  Ext, BUS, Vagina: Normal with mild atrophic changes  Cervix: Normal  Uterus: Anteverted, normal size, shape and contour, midline and mobile nontender   Adnexa: Without masses or tenderness    Anus and perineum: Normal   Rectovaginal: Normal sphincter tone without palpated masses or tenderness.    Assessment/Plan:  56 y.o. [id number] female for annual exam with irregular menses, abstinent contraception.   1. Irregular menses/perimenopausal symptoms. Patient having less frequent menses but notes that her PMS symptoms have intensified. She's having a lot of anxiety and tension preceding her menses. Even the months that she  skips she will have 1-2 weeks of symptoms proceeding her missed menses. Also some bloating and breast tenderness. I reviewed the whole perimenopausal timeframe and what to expect. As long as less frequent but regular menses when they occur them will monitor. Prolonged or atypical bleeding or goes more than one-year without menses and then bleeds she knows to report this. Options for management for her PMS type symptoms were reviewed with the patient.  OTC products up to and including prescription medications discussed. Fluoxetine intermittently or continuously reviewed. At this point patient does want to try fluoxetine 20 mg daily. We'll see if that does not control her symptoms. She will call me if she has continued symptoms despite starting the medication or other side effects occur. Worsening anxiety/depression risks also discussed. Patient will call me if she has any issues after starting. 2. Mammography scheduled and she will follow up for this. SBE monthly reviewed. Breast exam normal today. 3. Colonoscopy 2015. Repeat at their recommended interval. 4. Pap smear 2017. No Pap smear done today. History of positive high-risk HPV negative subtype 16, 18/45 2014. Negative follow up Pap smear/HPV 2015 and 2017. We'll continue follow up Pap smears less frequent screening intervals per current screening guidelines. 5. Health maintenance.  Patient reports routine blood work recently done elsewhere. Follow up in one year, sooner as needed.  Additional time in excess of her routine gynecologic exam was spent in direct face to face counseling and coordination of care in regards to her perimenopausal symptoms and ultimate treatment of her PMS with medication.    [name], 11:01 AM 04/05/2017

## 2017-04-05 NOTE — Patient Instructions (Signed)
Start on the fluoxetine as we discussed. Call if you have any significant issues with this or do not notice any relief of your symptoms and we can adjust the dose.

## 2017-04-25 ENCOUNTER — Ambulatory Visit
Admission: RE | Admit: 2017-04-25 | Discharge: 2017-04-25 | Disposition: A | Discharge status: Home / Self Care | Payer: Federal, State, Local not specified - PPO | Source: Ambulatory Visit | Attending: Gynecology | Admitting: Gynecology

## 2017-04-25 DIAGNOSIS — Z1231 Encounter for screening mammogram for malignant neoplasm of breast: Secondary | ICD-10-CM

## 2017-05-31 DIAGNOSIS — K080 Exfoliation of teeth due to systemic causes: Secondary | ICD-10-CM | POA: Diagnosis not present

## 2017-12-06 DIAGNOSIS — K080 Exfoliation of teeth due to systemic causes: Secondary | ICD-10-CM | POA: Diagnosis not present

## 2018-04-18 DIAGNOSIS — K080 Exfoliation of teeth due to systemic causes: Secondary | ICD-10-CM | POA: Diagnosis not present

## 2018-06-14 ENCOUNTER — Telehealth: Payer: Self-pay | Admitting: *Deleted

## 2018-06-14 MED ORDER — SULFAMETHOXAZOLE-TRIMETHOPRIM 800-160 MG PO TABS: 1.0000 | ORAL_TABLET | Freq: Two times a day (BID) | ORAL | 0 refills | Status: DC

## 2018-06-14 NOTE — Telephone Encounter (Signed)
Patient called c/o urgency and burning with urination, has been taking care of sick mother in [place]. She asked if you could prescribed Rx to help with symptoms, annual exam is scheduled on 08/08/18. Please advise

## 2018-06-14 NOTE — Telephone Encounter (Signed)
Patient informed, Rx sent.  

## 2018-06-14 NOTE — Telephone Encounter (Signed)
Septra DS 1 p.o. twice daily x3 days 

## 2018-06-20 ENCOUNTER — Other Ambulatory Visit: Payer: Self-pay | Admitting: Gynecology

## 2018-06-20 DIAGNOSIS — Z1231 Encounter for screening mammogram for malignant neoplasm of breast: Secondary | ICD-10-CM

## 2018-06-25 DIAGNOSIS — K080 Exfoliation of teeth due to systemic causes: Secondary | ICD-10-CM | POA: Diagnosis not present

## 2018-07-16 ENCOUNTER — Ambulatory Visit
Admission: RE | Admit: 2018-07-16 | Discharge: 2018-07-16 | Disposition: A | Discharge status: Home / Self Care | Payer: Federal, State, Local not specified - PPO | Source: Ambulatory Visit | Attending: Gynecology | Admitting: Gynecology

## 2018-07-16 DIAGNOSIS — Z1231 Encounter for screening mammogram for malignant neoplasm of breast: Secondary | ICD-10-CM

## 2018-08-08 ENCOUNTER — Ambulatory Visit (INDEPENDENT_AMBULATORY_CARE_PROVIDER_SITE_OTHER): Payer: Federal, State, Local not specified - PPO | Admitting: Gynecology

## 2018-08-08 ENCOUNTER — Encounter: Payer: Self-pay | Admitting: Gynecology

## 2018-08-08 VITALS — BP 122/78 | Ht 61.0 in | Wt 151.0 lb

## 2018-08-08 DIAGNOSIS — Z01419 Encounter for gynecological examination (general) (routine) without abnormal findings: Secondary | ICD-10-CM | POA: Diagnosis not present

## 2018-08-08 DIAGNOSIS — R300 Dysuria: Secondary | ICD-10-CM

## 2018-08-08 DIAGNOSIS — Z23 Encounter for immunization: Secondary | ICD-10-CM | POA: Diagnosis not present

## 2018-08-08 DIAGNOSIS — Z13220 Encounter for screening for lipoid disorders: Secondary | ICD-10-CM | POA: Diagnosis not present

## 2018-08-08 NOTE — Patient Instructions (Addendum)
Follow-up for fasting lab work as scheduled  Follow-up in 1 year for annual exam, sooner if any issues.

## 2018-08-08 NOTE — Progress Notes (Signed)
    Colton Whitworth 08-20-1961 [id number]        57 y.o.  [id number] for annual gynecologic exam.  Continues with monthly menses.  Past medical history,surgical history, problem list, medications, allergies, family history and social history were all reviewed and documented as reviewed in the EPIC chart.  ROS:  Performed with pertinent positives and negatives included in the history, assessment and plan.   Additional significant findings : None   Exam: [name] assistant Vitals:   08/08/18 1510  BP: 122/78  Weight: 151 lb (68.5 kg)  Height: 5\' 1"  (1.549 m)   Body mass index is 28.53 kg/m.  General appearance:  Normal affect, orientation and appearance. Skin: Grossly normal HEENT: Without gross lesions.  No cervical or supraclavicular adenopathy. Thyroid normal.  Lungs:  Clear without wheezing, rales or rhonchi Cardiac: RR, without RMG Abdominal:  Soft, nontender, without masses, guarding, rebound, organomegaly or hernia Breasts:  Examined lying and sitting without masses, retractions, discharge or axillary adenopathy. Pelvic:  Ext, BUS, Vagina: Normal  Cervix: Normal.  Pap smear done  Uterus: Anteverted, normal size, shape and contour, midline and mobile nontender   Adnexa: Without masses or tenderness    Anus and perineum: Normal   Rectovaginal: Normal sphincter tone without palpated masses or tenderness.    Assessment/Plan:  57 y.o. [id number] female for annual gynecologic exam with regular menses, condom contraception.   1. Continues with regular menses.  Notes her sister went through menopause at age 47.  Will report any significant menopausal symptoms or irregular bleeding. 2. Colonoscopy 2015.  Repeat at their recommended interval. 3. Mammography 07/2018.  Continue with annual mammography next year.  Breast exam normal today. 4. Pap smear 03/2016.  Pap smear done today.  History of positive high risk HPV negative subtype 16, 18/45 in 2014.  Negative follow-up Pap smears/HPV in  2015 and 2017. 5. Health maintenance.  Future orders placed for CBC, CMP and lipid profile.  Patient will return fasting.  Follow-up in 1 year, sooner if issues.   [name], 3:31 PM 08/08/2018

## 2018-08-08 NOTE — Addendum Note (Signed)
Addended by: Nelva Nay on: 08/08/2018 04:12 PM   Modules accepted: Orders

## 2018-08-09 LAB — PAP IG W/ RFLX HPV ASCU

## 2018-08-10 LAB — URINALYSIS, COMPLETE W/RFL CULTURE
Bacteria, UA: NONE SEEN /HPF
Bilirubin Urine: NEGATIVE
Glucose, UA: NEGATIVE
Hgb urine dipstick: NEGATIVE
Hyaline Cast: NONE SEEN /LPF
Ketones, ur: NEGATIVE
Leukocyte Esterase: NEGATIVE
Nitrites, Initial: NEGATIVE
Protein, ur: NEGATIVE
Specific Gravity, Urine: 1.008 (ref 1.001–1.03)
Squamous Epithelial / HPF: NONE SEEN /HPF
pH: 6.5 (ref 5.0–8.0)

## 2018-08-10 LAB — URINE CULTURE
MICRO NUMBER:: 91347570
SPECIMEN QUALITY:: ADEQUATE

## 2018-08-10 LAB — CULTURE INDICATED

## 2018-08-12 ENCOUNTER — Telehealth: Payer: Self-pay | Admitting: *Deleted

## 2018-08-12 NOTE — Telephone Encounter (Signed)
Patient called and left message asking me to call her with urine results on 08/08/18. I called and left detailed message on cell negative urine results.

## 2019-02-10 DIAGNOSIS — K080 Exfoliation of teeth due to systemic causes: Secondary | ICD-10-CM | POA: Diagnosis not present

## 2019-06-23 ENCOUNTER — Other Ambulatory Visit: Payer: Self-pay | Admitting: Gynecology

## 2019-06-23 DIAGNOSIS — Z1231 Encounter for screening mammogram for malignant neoplasm of breast: Secondary | ICD-10-CM

## 2019-07-01 ENCOUNTER — Encounter: Payer: Self-pay | Admitting: Gynecology

## 2019-08-04 ENCOUNTER — Other Ambulatory Visit: Payer: Self-pay

## 2019-08-04 DIAGNOSIS — Z20822 Contact with and (suspected) exposure to covid-19: Secondary | ICD-10-CM

## 2019-08-05 ENCOUNTER — Encounter: Payer: Self-pay | Admitting: Gynecology

## 2019-08-05 ENCOUNTER — Ambulatory Visit: Payer: Federal, State, Local not specified - PPO

## 2019-08-05 LAB — NOVEL CORONAVIRUS, NAA: SARS-CoV-2, NAA: DETECTED — AB

## 2019-08-15 ENCOUNTER — Encounter: Payer: Federal, State, Local not specified - PPO | Admitting: Gynecology

## 2019-08-19 DIAGNOSIS — K080 Exfoliation of teeth due to systemic causes: Secondary | ICD-10-CM | POA: Diagnosis not present

## 2019-09-01 DIAGNOSIS — D1801 Hemangioma of skin and subcutaneous tissue: Secondary | ICD-10-CM | POA: Diagnosis not present

## 2019-09-01 DIAGNOSIS — L720 Epidermal cyst: Secondary | ICD-10-CM | POA: Diagnosis not present

## 2019-09-02 ENCOUNTER — Other Ambulatory Visit: Payer: Self-pay

## 2019-09-03 ENCOUNTER — Encounter: Payer: Self-pay | Admitting: Gynecology

## 2019-09-03 ENCOUNTER — Ambulatory Visit (INDEPENDENT_AMBULATORY_CARE_PROVIDER_SITE_OTHER): Payer: Federal, State, Local not specified - PPO | Admitting: Gynecology

## 2019-09-03 VITALS — BP 118/76 | Ht 61.0 in | Wt 151.0 lb

## 2019-09-03 DIAGNOSIS — Z01419 Encounter for gynecological examination (general) (routine) without abnormal findings: Secondary | ICD-10-CM | POA: Diagnosis not present

## 2019-09-03 DIAGNOSIS — N951 Menopausal and female climacteric states: Secondary | ICD-10-CM

## 2019-09-03 NOTE — Progress Notes (Signed)
    [name] 1960-12-25 [id number]        58 y.o.  [id number] for annual gynecologic exam.  Without significant gynecologic complaints.  Notes some issue with insomnia.  Past medical history,surgical history, problem list, medications, allergies, family history and social history were all reviewed and documented as reviewed in the EPIC chart.  ROS:  Performed with pertinent positives and negatives included in the history, assessment and plan.   Additional significant findings : None   Exam: [name] assistant Vitals:   09/03/19 1023  BP: 118/76  Weight: 151 lb (68.5 kg)  Height: 5\' 1"  (1.549 m)   Body mass index is 28.53 kg/m.  General appearance:  Normal affect, orientation and appearance. Skin: Grossly normal HEENT: Without gross lesions.  No cervical or supraclavicular adenopathy. Thyroid normal.  Lungs:  Clear without wheezing, rales or rhonchi Cardiac: RR, without RMG Abdominal:  Soft, nontender, without masses, guarding, rebound, organomegaly or hernia Breasts:  Examined lying and sitting without masses, retractions, discharge or axillary adenopathy. Pelvic:  Ext, BUS, Vagina: Normal  Cervix: Normal  Uterus: Anteverted, normal size, shape and contour, midline and mobile nontender   Adnexa: Without masses or tenderness    Anus and perineum: Normal   Rectovaginal: Normal sphincter tone without palpated masses or tenderness.    Assessment/Plan:  58 y.o. [id number] female for annual gynecologic exam.  Perimenopausal  1. Perimenopausal.  Last menstrual period mid July.  Having some hot flushes and sweats but not overly bothersome.  Having some issues with insomnia waking at 2 AM.  Falls asleep easily.  Discussed various strategies.  Discussed possible sleep aids.  Patient will call if she feels the need for sleep aids.  Otherwise we will monitor for now.  Will call if any prolonged or atypical bleeding or goes more than 1 year without menses and then bleeds. 2. Mammography  scheduled in January.  Breast exam normal today. 3. Colonoscopy 2015.  Repeat at their recommended interval. 4. Pap smear 2019.  No Pap smear done today.  History of positive high risk HPV negative subtype 16, 18/45 and 2014.  Negative Pap smears/HPV in 2015 and 2017.  Plan repeat Pap smear at 3-year interval. 5. Health maintenance.  No routine lab work done as patient plans to arrange appointment with primary provider and will have this done there.  Follow-up 1 year, sooner as needed.   [name], 10:54 AM 09/03/2019

## 2019-09-03 NOTE — Patient Instructions (Signed)
Follow-up for your mammogram as scheduled.  Follow-up with your arranged primary care provider appointment and blood work.  Follow-up in 1 year for annual exam

## 2019-09-09 ENCOUNTER — Telehealth: Payer: Self-pay

## 2019-09-09 NOTE — Telephone Encounter (Signed)
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 States that her husband is a current patient of yours as well. Would you like to accept her?   Copied from [place] [id number]. Topic: Appointment Scheduling - Prior Auth Required for Appointment >> Sep 09, 2019 10:26 AM Richardo Priest, NT wrote: No appointment has been scheduled. Patient is requesting new patient appointment with Dr.John as husband does see him. Per scheduling protocol, this appointment requires a prior authorization prior to scheduling.  Route to department's PEC pool.

## 2019-09-09 NOTE — Telephone Encounter (Signed)
Ok with me, thanks.

## 2019-10-06 ENCOUNTER — Ambulatory Visit: Payer: Federal, State, Local not specified - PPO

## 2019-10-08 ENCOUNTER — Ambulatory Visit: Payer: Federal, State, Local not specified - PPO | Admitting: Internal Medicine

## 2019-10-08 ENCOUNTER — Other Ambulatory Visit: Payer: Self-pay

## 2019-10-08 ENCOUNTER — Encounter: Payer: Self-pay | Admitting: Internal Medicine

## 2019-10-08 ENCOUNTER — Other Ambulatory Visit: Payer: Self-pay | Admitting: Internal Medicine

## 2019-10-08 VITALS — BP 144/82 | HR 73 | Temp 97.8°F | Resp 12 | Ht 61.0 in | Wt 152.4 lb

## 2019-10-08 DIAGNOSIS — R030 Elevated blood-pressure reading, without diagnosis of hypertension: Secondary | ICD-10-CM | POA: Diagnosis not present

## 2019-10-08 DIAGNOSIS — E781 Pure hyperglyceridemia: Secondary | ICD-10-CM | POA: Insufficient documentation

## 2019-10-08 DIAGNOSIS — E785 Hyperlipidemia, unspecified: Secondary | ICD-10-CM

## 2019-10-08 DIAGNOSIS — E538 Deficiency of other specified B group vitamins: Secondary | ICD-10-CM | POA: Diagnosis not present

## 2019-10-08 DIAGNOSIS — Z0000 Encounter for general adult medical examination without abnormal findings: Secondary | ICD-10-CM | POA: Diagnosis not present

## 2019-10-08 DIAGNOSIS — E611 Iron deficiency: Secondary | ICD-10-CM

## 2019-10-08 DIAGNOSIS — E559 Vitamin D deficiency, unspecified: Secondary | ICD-10-CM

## 2019-10-08 DIAGNOSIS — Z0001 Encounter for general adult medical examination with abnormal findings: Secondary | ICD-10-CM | POA: Insufficient documentation

## 2019-10-08 DIAGNOSIS — U071 COVID-19: Secondary | ICD-10-CM | POA: Insufficient documentation

## 2019-10-08 LAB — HEPATIC FUNCTION PANEL
ALT: 16 U/L (ref 0–35)
AST: 19 U/L (ref 0–37)
Albumin: 4.4 g/dL (ref 3.5–5.2)
Alkaline Phosphatase: 81 U/L (ref 39–117)
Bilirubin, Direct: 0.1 mg/dL (ref 0.0–0.3)
Total Bilirubin: 0.4 mg/dL (ref 0.2–1.2)
Total Protein: 7.5 g/dL (ref 6.0–8.3)

## 2019-10-08 LAB — VITAMIN D 25 HYDROXY (VIT D DEFICIENCY, FRACTURES): VITD: 29.23 ng/mL — ABNORMAL LOW (ref 30.00–100.00)

## 2019-10-08 LAB — URINALYSIS, ROUTINE W REFLEX MICROSCOPIC
Bilirubin Urine: NEGATIVE
Ketones, ur: NEGATIVE
Leukocytes,Ua: NEGATIVE
Nitrite: NEGATIVE
RBC / HPF: NONE SEEN (ref 0–?)
Specific Gravity, Urine: 1.02 (ref 1.000–1.030)
Total Protein, Urine: NEGATIVE
Urine Glucose: NEGATIVE
Urobilinogen, UA: 0.2 (ref 0.0–1.0)
WBC, UA: NONE SEEN (ref 0–?)
pH: 6.5 (ref 5.0–8.0)

## 2019-10-08 LAB — BASIC METABOLIC PANEL
BUN: 9 mg/dL (ref 6–23)
CO2: 30 mEq/L (ref 19–32)
Calcium: 10 mg/dL (ref 8.4–10.5)
Chloride: 101 mEq/L (ref 96–112)
Creatinine, Ser: 0.65 mg/dL (ref 0.40–1.20)
GFR: 93.88 mL/min (ref >60.00)
Glucose, Bld: 88 mg/dL (ref 70–99)
Potassium: 4.1 mEq/L (ref 3.5–5.1)
Sodium: 137 mEq/L (ref 135–145)

## 2019-10-08 LAB — CBC WITH DIFFERENTIAL/PLATELET
Basophils Absolute: 0 10*3/uL (ref 0.0–0.1)
Basophils Relative: 0.4 % (ref 0.0–3.0)
Eosinophils Absolute: 0.3 10*3/uL (ref 0.0–0.7)
Eosinophils Relative: 3.1 % (ref 0.0–5.0)
HCT: 42.2 % (ref 36.0–46.0)
Hemoglobin: 14.3 g/dL (ref 12.0–15.0)
Lymphocytes Relative: 30.6 % (ref 12.0–46.0)
Lymphs Abs: 2.9 10*3/uL (ref 0.7–4.0)
MCHC: 34 g/dL (ref 30.0–36.0)
MCV: 88.2 fl (ref 78.0–100.0)
Monocytes Absolute: 1 10*3/uL (ref 0.1–1.0)
Monocytes Relative: 10.2 % (ref 3.0–12.0)
Neutro Abs: 5.2 10*3/uL (ref 1.4–7.7)
Neutrophils Relative %: 55.7 % (ref 43.0–77.0)
Platelets: 253 10*3/uL (ref 150.0–400.0)
RBC: 4.78 Mil/uL (ref 3.87–5.11)
RDW: 14.3 % (ref 11.5–15.5)
WBC: 9.3 10*3/uL (ref 4.0–10.5)

## 2019-10-08 LAB — LIPID PANEL
Cholesterol: 208 mg/dL — ABNORMAL HIGH (ref 0–200)
HDL: 49.4 mg/dL (ref >39.00)
LDL Cholesterol: 119 mg/dL — ABNORMAL HIGH (ref 0–99)
NonHDL: 159
Total CHOL/HDL Ratio: 4
Triglycerides: 200 mg/dL — ABNORMAL HIGH (ref 0.0–149.0)
VLDL: 40 mg/dL (ref 0.0–40.0)

## 2019-10-08 LAB — IBC PANEL
Iron: 81 ug/dL (ref 42–145)
Saturation Ratios: 29.8 % (ref 20.0–50.0)
Transferrin: 194 mg/dL — ABNORMAL LOW (ref 212.0–360.0)

## 2019-10-08 LAB — VITAMIN B12: Vitamin B-12: 444 pg/mL (ref 211–911)

## 2019-10-08 LAB — TSH: TSH: 3.42 u[IU]/mL (ref 0.35–4.50)

## 2019-10-08 MED ORDER — VITAMIN D (ERGOCALCIFEROL) 1.25 MG (50000 UNIT) PO CAPS: 50000.0000 [IU] | ORAL_CAPSULE | ORAL | 0 refills | Status: DC

## 2019-10-08 NOTE — Progress Notes (Signed)
Subjective:    Patient ID: [name], female    DOB: 03-12-1961, 59 y.o.   MRN: [id number]  HPI  Here for wellness and f/u;  Overall doing ok;  Pt denies Chest pain, worsening SOB, DOE, wheezing, orthopnea, PND, worsening LE edema, palpitations, dizziness or syncope.  Pt denies neurological change such as new headache, facial or extremity weakness.  Pt denies polydipsia, polyuria, or low sugar symptoms. Pt states overall good compliance with treatment and medications, good tolerability, and has been trying to follow appropriate diet.  Pt denies worsening depressive symptoms, suicidal ideation or panic. No fever, night sweats, wt loss, loss of appetite, or other constitutional symptoms.  Pt states good ability with ADL's, has low fall risk, home safety reviewed and adequate, no other significant changes in hearing or vision, and only occasionally active with exercise.  No new complaints Past Medical History:  Diagnosis Date  . Arthritis   . Cancer [facility])    skin cancer chest  . Cervical high risk HPV (human papillomavirus) test positive 11/2012   Normal cytology recommend repeat Pap with HPV one year  . LGSIL (low grade squamous intraepithelial dysplasia)    07/2001  . Urinary incontinence    [facility]I   Past Surgical History:  Procedure Laterality Date  . COLPOSCOPY    . HAND SURGERY  2012   RIGHT HAND MIDDLE FINGER  . LASIK    . MOUTH SURGERY  1981  . TONSILLECTOMY AND ADENOIDECTOMY      reports that she has never smoked. She has never used smokeless tobacco. She reports current alcohol use of about 5.0 standard drinks of alcohol per week. She reports that she does not use drugs. family history includes Cancer in her father; Diabetes in her father; Hypertension in her father and mother; Kidney disease in her father; Seizures in her mother; Stroke in her maternal grandmother and mother. Allergies  Allergen Reactions  . Penicillins Rash    Rash, hives    Current Outpatient Medications on  File Prior to Visit  Medication Sig Dispense Refill  . calcium citrate-vitamin D (CITRACAL+D) 315-200 MG-UNIT per tablet Take 1 tablet by mouth 2 (two) times daily.    . fish oil-omega-3 fatty acids 1000 MG capsule Take 720 mg by mouth daily.     . Multiple Vitamin (MULTIVITAMIN) tablet Take 1 tablet by mouth daily.    . psyllium (METAMUCIL) 58.6 % powder Take 1 packet by mouth 3 (three) times daily.     No current facility-administered medications on file prior to visit.   Review of Systems Constitutional: Negative for other unusual diaphoresis, sweats, appetite or weight changes HENT: Negative for other worsening hearing loss, ear pain, facial swelling, mouth sores or neck stiffness.   Eyes: Negative for other worsening pain, redness or other visual disturbance.  Respiratory: Negative for other stridor or swelling Cardiovascular: Negative for other palpitations or other chest pain  Gastrointestinal: Negative for worsening diarrhea or loose stools, blood in stool, distention or other pain Genitourinary: Negative for hematuria, flank pain or other change in urine volume.  Musculoskeletal: Negative for myalgias or other joint swelling.  Skin: Negative for other color change, or other wound or worsening drainage.  Neurological: Negative for other syncope or numbness. Hematological: Negative for other adenopathy or swelling Psychiatric/Behavioral: Negative for hallucinations, other worsening agitation, SI, self-injury, or new decreased concentration All otherwise neg per pt     Objective:   Physical Exam BP (!) 144/82   Pulse 73  Temp 97.8 F (36.6 C)   Resp 12   Ht 5\' 1"  (1.549 m)   Wt 152 lb 6.4 oz (69.1 kg)   SpO2 98%   BMI 28.80 kg/m  VS noted,  Constitutional: Pt is oriented to person, place, and time. Appears well-developed and well-nourished, in no significant distress and comfortable Head: Normocephalic and atraumatic  Eyes: Conjunctivae and EOM are normal. Pupils are  equal, round, and reactive to light Right Ear: External ear normal without discharge Left Ear: External ear normal without discharge Nose: Nose without discharge or deformity Mouth/Throat: Oropharynx is without other ulcerations and moist  Neck: Normal range of motion. Neck supple. No JVD present. No tracheal deviation present or significant neck LA or mass Cardiovascular: Normal rate, regular rhythm, normal heart sounds and intact distal pulses.   Pulmonary/Chest: WOB normal and breath sounds without rales or wheezing  Abdominal: Soft. Bowel sounds are normal. NT. No HSM  Musculoskeletal: Normal range of motion. Exhibits no edema Lymphadenopathy: Has no other cervical adenopathy.  Neurological: Pt is alert and oriented to person, place, and time. Pt has normal reflexes. No cranial nerve deficit. Motor grossly intact, Gait intact Skin: Skin is warm and dry. No rash noted or new ulcerations Psychiatric:  Has normal mood and affect. Behavior is normal without agitation All otherwise neg per pt Lab Results  Component Value Date   WBC 9.3 10/08/2019   HGB 14.3 10/08/2019   HCT 42.2 10/08/2019   PLT 253.0 10/08/2019   GLUCOSE 88 10/08/2019   CHOL 208 (H) 10/08/2019   TRIG 200.0 (H) 10/08/2019   HDL 49.40 10/08/2019   LDLCALC 119 (H) 10/08/2019   ALT 16 10/08/2019   AST 19 10/08/2019   NA 137 10/08/2019   K 4.1 10/08/2019   CL 101 10/08/2019   CREATININE 0.65 10/08/2019   BUN 9 10/08/2019   CO2 30 10/08/2019   TSH 3.42 10/08/2019       Assessment & Plan:

## 2019-10-08 NOTE — Patient Instructions (Signed)
Please continue all other medications as before, and refills have been done if requested.  Please have the pharmacy call with any other refills you may need.  Please continue your efforts at being more active, low cholesterol diet, and weight control.  You are otherwise up to date with prevention measures today.  Please keep your appointments with your specialists as you may have planned  Please go to the LAB at the blood drawing area for the tests to be done  You will be contacted by phone if any changes need to be made immediately.  Otherwise, you will receive a letter about your results with an explanation, but please check with MyChart first.  Please remember to sign up for MyChart if you have not done so, as this will be important to you in the future with finding out test results, communicating by private email, and scheduling acute appointments online when needed.  Please return in 1 year for your yearly visit, or sooner if needed, with Lab testing done 3-5 days before at the [facility] - please make appt as you leave today for this

## 2019-10-11 ENCOUNTER — Encounter: Payer: Self-pay | Admitting: Internal Medicine

## 2019-10-11 NOTE — Assessment & Plan Note (Signed)
Mild elevated, for low chol diet, declines statin for now

## 2019-10-11 NOTE — Assessment & Plan Note (Signed)

## 2019-10-11 NOTE — Assessment & Plan Note (Signed)
Pt to check BP at home daily for 2 wks at home and call with results

## 2019-10-22 DIAGNOSIS — D225 Melanocytic nevi of trunk: Secondary | ICD-10-CM | POA: Diagnosis not present

## 2019-10-22 DIAGNOSIS — D485 Neoplasm of uncertain behavior of skin: Secondary | ICD-10-CM | POA: Diagnosis not present

## 2019-11-10 ENCOUNTER — Ambulatory Visit
Admission: RE | Admit: 2019-11-10 | Discharge: 2019-11-10 | Disposition: A | Discharge status: Home / Self Care | Payer: Federal, State, Local not specified - PPO | Source: Ambulatory Visit | Attending: Gynecology | Admitting: Gynecology

## 2019-11-10 ENCOUNTER — Other Ambulatory Visit: Payer: Self-pay

## 2019-11-10 DIAGNOSIS — Z1231 Encounter for screening mammogram for malignant neoplasm of breast: Secondary | ICD-10-CM | POA: Diagnosis not present

## 2019-12-15 ENCOUNTER — Ambulatory Visit: Payer: Federal, State, Local not specified - PPO | Attending: Internal Medicine

## 2019-12-15 DIAGNOSIS — Z23 Encounter for immunization: Secondary | ICD-10-CM

## 2019-12-15 NOTE — Progress Notes (Signed)
   Covid-19 Vaccination Clinic  Name:  [name]    MRN: [id number] DOB: Jun 10, 1961  12/15/2019  [name] was observed post Covid-19 immunization for 15 minutes without incident. She was provided with Vaccine Information Sheet and instruction to access the V-Safe system.   [name] was instructed to call 911 with any severe reactions post vaccine: Marland Kitchen Difficulty breathing  . Swelling of face and throat  . A fast heartbeat  . A bad rash all over body  . Dizziness and weakness   Immunizations Administered    Name Date Dose VIS Date Route   Pfizer COVID-19 Vaccine 12/15/2019  4:24 PM 0.3 mL 09/12/2019 Intramuscular   Manufacturer: [place]   Lot: [id number]   [place]: [id number]

## 2020-01-07 ENCOUNTER — Ambulatory Visit: Payer: Federal, State, Local not specified - PPO | Attending: Internal Medicine

## 2020-01-07 DIAGNOSIS — Z23 Encounter for immunization: Secondary | ICD-10-CM

## 2020-01-07 NOTE — Progress Notes (Signed)
   Covid-19 Vaccination Clinic  Name:  [name]    MRN: [id number] DOB: 04-Jun-1961  01/07/2020  [name] was observed post Covid-19 immunization for 15 minutes without incident. She was provided with Vaccine Information Sheet and instruction to access the V-Safe system.   [name] was instructed to call 911 with any severe reactions post vaccine: Marland Kitchen Difficulty breathing  . Swelling of face and throat  . A fast heartbeat  . A bad rash all over body  . Dizziness and weakness   Immunizations Administered    Name Date Dose VIS Date Route   Pfizer COVID-19 Vaccine 01/07/2020  2:26 PM 0.3 mL 09/12/2019 Intramuscular   Manufacturer: [place]   Lot: [id number]   [place]: [id number]

## 2020-02-16 DIAGNOSIS — H04123 Dry eye syndrome of bilateral lacrimal glands: Secondary | ICD-10-CM | POA: Diagnosis not present

## 2020-02-16 DIAGNOSIS — H5203 Hypermetropia, bilateral: Secondary | ICD-10-CM | POA: Diagnosis not present

## 2020-05-25 ENCOUNTER — Telehealth: Payer: Self-pay | Admitting: Internal Medicine

## 2020-05-25 NOTE — Telephone Encounter (Signed)
New Message:   LVM for pt to call back and schedule appt for Medication  Refills.

## 2020-05-25 NOTE — Telephone Encounter (Signed)
New Message:   Pt is calling and would like to know if she can do a virtual/telephone call for her medication refills. I have advised the pt that the Dr is out of the office this week. Please advise.

## 2020-08-30 DIAGNOSIS — D485 Neoplasm of uncertain behavior of skin: Secondary | ICD-10-CM | POA: Diagnosis not present

## 2020-08-30 DIAGNOSIS — B078 Other viral warts: Secondary | ICD-10-CM | POA: Diagnosis not present

## 2020-08-30 DIAGNOSIS — D1801 Hemangioma of skin and subcutaneous tissue: Secondary | ICD-10-CM | POA: Diagnosis not present

## 2020-09-08 ENCOUNTER — Encounter: Payer: Self-pay | Admitting: Obstetrics and Gynecology

## 2020-09-08 ENCOUNTER — Ambulatory Visit (INDEPENDENT_AMBULATORY_CARE_PROVIDER_SITE_OTHER): Payer: Federal, State, Local not specified - PPO | Admitting: Obstetrics and Gynecology

## 2020-09-08 ENCOUNTER — Other Ambulatory Visit: Payer: Self-pay

## 2020-09-08 VITALS — BP 120/76 | Ht 61.0 in | Wt 152.0 lb

## 2020-09-08 DIAGNOSIS — G4700 Insomnia, unspecified: Secondary | ICD-10-CM

## 2020-09-08 DIAGNOSIS — N951 Menopausal and female climacteric states: Secondary | ICD-10-CM

## 2020-09-08 DIAGNOSIS — Z01419 Encounter for gynecological examination (general) (routine) without abnormal findings: Secondary | ICD-10-CM | POA: Diagnosis not present

## 2020-09-08 MED ORDER — ZOLPIDEM TARTRATE 5 MG PO TABS: 5.0000 mg | ORAL_TABLET | Freq: Every evening | ORAL | 5 refills | Status: DC | PRN

## 2020-09-08 NOTE — Progress Notes (Signed)
   [name] 05/04/1961 [id number]  SUBJECTIVE:  59 y.o. [id number] female for annual routine gynecologic exam. She has no gynecologic concerns. Still not sleeping well, benadryl helps only sometimes.  Current Outpatient Medications  Medication Sig Dispense Refill  . calcium citrate-vitamin D (CITRACAL+D) 315-200 MG-UNIT per tablet Take 1 tablet by mouth 2 (two) times daily.    . fish oil-omega-3 fatty acids 1000 MG capsule Take 720 mg by mouth daily.     . Multiple Vitamin (MULTIVITAMIN) tablet Take 1 tablet by mouth daily.    . psyllium (METAMUCIL) 58.6 % powder Take 1 packet by mouth 3 (three) times daily.    . TURMERIC PO Take by mouth.    Marland Kitchen VITAMIN D PO Take by mouth.     No current facility-administered medications for this visit.   Allergies: Penicillins  No LMP recorded. Patient is perimenopausal.  Past medical history,surgical history, problem list, medications, allergies, family history and social history were all reviewed and documented as reviewed in the EPIC chart.  ROS: Pertinent positives and negatives as reviewed in HPI   OBJECTIVE:  BP 120/76   Ht 5\' 1"  (1.549 m)   Wt 152 lb (68.9 kg)   BMI 28.72 kg/m  The patient appears well, alert, oriented, in no distress. ENT normal.  Neck supple. No cervical or supraclavicular adenopathy or thyromegaly.  Lungs are clear, good air entry, no wheezes, rhonchi or rales. S1 and S2 normal, no murmurs, regular rate and rhythm.  Abdomen soft without tenderness, guarding, mass or organomegaly.  Neurological is normal, no focal findings.  BREAST EXAM: breasts appear normal, no suspicious masses, no skin or nipple changes or axillary nodes  PELVIC EXAM: VULVA: normal appearing vulva with mild atrophic change, no masses, tenderness or lesions, VAGINA: normal appearing vagina with mild atrophic change, normal color and discharge, no lesions, CERVIX: normal appearing cervix without discharge or lesions, UTERUS: uterus is normal size,  shape, consistency and nontender, ADNEXA: normal adnexa in size, nontender and no masses  Chaperone: [name] present during the examination  ASSESSMENT:  59 y.o. [id number] here for annual gynecologic exam  PLAN:   1.  Perimenopausal.  Did have a period once over this last summer.  No significant vasomotor symptoms.  No heavy vaginal bleeding.  Still having issues with insomnia.  Tends to fall asleep pretty easily but does wake up very early and has trouble falling asleep.  Has tried Benadryl with minimal effect.  Discussed options including referral for sleep evaluation versus trying scription sleep aids.  I suggested trying zolpidem 5 mg at a lower dose first, she is made aware of the potential side effects of sleepwalking/irregular sleep behaviors, next-day drowsiness, mental clarity disturbances, hallucinations, etc. Prescription for #30 with 5 refills sent to her pharmacy.  Will follow up with any problems or side effects. 2. Pap smear 08/2018.  Positive high-risk HPV with negative subtype 16, 18, 45 in 2014.  Negative Pap smear/HPV in 2015 and 2017.  Plan to repeat Pap smear next year at the 3-year interval in 2022. 3. Mammogram 11/2019.  Normal breast exam today. She will continue with annual mammograms. 4. Colonoscopy 2015.  She will follow up at the recommended interval.  5. DEXA never.  Plan at age 13.  Encourage vitamin D, calcium, weightbearing exercise. 6. Health maintenance.  No labs today as she normally has these completed elsewhere.  Return annually or sooner, prn.  Joseph Pierini MD 09/08/20

## 2020-11-01 ENCOUNTER — Other Ambulatory Visit: Payer: Self-pay | Admitting: Internal Medicine

## 2020-11-01 DIAGNOSIS — Z1231 Encounter for screening mammogram for malignant neoplasm of breast: Secondary | ICD-10-CM

## 2020-12-03 ENCOUNTER — Other Ambulatory Visit: Payer: Self-pay

## 2020-12-03 ENCOUNTER — Ambulatory Visit (INDEPENDENT_AMBULATORY_CARE_PROVIDER_SITE_OTHER): Payer: Federal, State, Local not specified - PPO | Admitting: Internal Medicine

## 2020-12-03 ENCOUNTER — Encounter: Payer: Self-pay | Admitting: Internal Medicine

## 2020-12-03 VITALS — BP 142/90 | HR 78 | Ht 61.0 in | Wt 151.0 lb

## 2020-12-03 DIAGNOSIS — E559 Vitamin D deficiency, unspecified: Secondary | ICD-10-CM | POA: Insufficient documentation

## 2020-12-03 DIAGNOSIS — Z1159 Encounter for screening for other viral diseases: Secondary | ICD-10-CM | POA: Diagnosis not present

## 2020-12-03 DIAGNOSIS — R300 Dysuria: Secondary | ICD-10-CM

## 2020-12-03 DIAGNOSIS — Z114 Encounter for screening for human immunodeficiency virus [HIV]: Secondary | ICD-10-CM

## 2020-12-03 DIAGNOSIS — Z0001 Encounter for general adult medical examination with abnormal findings: Secondary | ICD-10-CM

## 2020-12-03 DIAGNOSIS — E7800 Pure hypercholesterolemia, unspecified: Secondary | ICD-10-CM | POA: Diagnosis not present

## 2020-12-03 DIAGNOSIS — E538 Deficiency of other specified B group vitamins: Secondary | ICD-10-CM

## 2020-12-03 DIAGNOSIS — R030 Elevated blood-pressure reading, without diagnosis of hypertension: Secondary | ICD-10-CM

## 2020-12-03 DIAGNOSIS — R350 Frequency of micturition: Secondary | ICD-10-CM | POA: Insufficient documentation

## 2020-12-03 DIAGNOSIS — Z0000 Encounter for general adult medical examination without abnormal findings: Secondary | ICD-10-CM

## 2020-12-03 LAB — LIPID PANEL
Cholesterol: 215 mg/dL — ABNORMAL HIGH (ref 0–200)
HDL: 46.3 mg/dL (ref >39.00)
NonHDL: 168.56
Total CHOL/HDL Ratio: 5
Triglycerides: 321 mg/dL — ABNORMAL HIGH (ref 0.0–149.0)
VLDL: 64.2 mg/dL — ABNORMAL HIGH (ref 0.0–40.0)

## 2020-12-03 LAB — HEPATIC FUNCTION PANEL
ALT: 16 U/L (ref 0–35)
AST: 17 U/L (ref 0–37)
Albumin: 4.7 g/dL (ref 3.5–5.2)
Alkaline Phosphatase: 96 U/L (ref 39–117)
Bilirubin, Direct: 0.1 mg/dL (ref 0.0–0.3)
Total Bilirubin: 0.4 mg/dL (ref 0.2–1.2)
Total Protein: 8.1 g/dL (ref 6.0–8.3)

## 2020-12-03 LAB — CBC WITH DIFFERENTIAL/PLATELET
Basophils Absolute: 0.1 10*3/uL (ref 0.0–0.1)
Basophils Relative: 1 % (ref 0.0–3.0)
Eosinophils Absolute: 0.4 10*3/uL (ref 0.0–0.7)
Eosinophils Relative: 3.6 % (ref 0.0–5.0)
HCT: 44.5 % (ref 36.0–46.0)
Hemoglobin: 15.3 g/dL — ABNORMAL HIGH (ref 12.0–15.0)
Lymphocytes Relative: 33.2 % (ref 12.0–46.0)
Lymphs Abs: 3.3 10*3/uL (ref 0.7–4.0)
MCHC: 34.3 g/dL (ref 30.0–36.0)
MCV: 86.2 fl (ref 78.0–100.0)
Monocytes Absolute: 0.8 10*3/uL (ref 0.1–1.0)
Monocytes Relative: 8.6 % (ref 3.0–12.0)
Neutro Abs: 5.2 10*3/uL (ref 1.4–7.7)
Neutrophils Relative %: 53.6 % (ref 43.0–77.0)
Platelets: 262 10*3/uL (ref 150.0–400.0)
RBC: 5.16 Mil/uL — ABNORMAL HIGH (ref 3.87–5.11)
RDW: 13.4 % (ref 11.5–15.5)
WBC: 9.8 10*3/uL (ref 4.0–10.5)

## 2020-12-03 LAB — URINALYSIS, ROUTINE W REFLEX MICROSCOPIC
Bilirubin Urine: NEGATIVE
Ketones, ur: NEGATIVE
Leukocytes,Ua: NEGATIVE
Nitrite: NEGATIVE
Specific Gravity, Urine: 1.01 (ref 1.000–1.030)
Total Protein, Urine: NEGATIVE
Urine Glucose: NEGATIVE
Urobilinogen, UA: 0.2 (ref 0.0–1.0)
WBC, UA: NONE SEEN (ref 0–?)
pH: 6.5 (ref 5.0–8.0)

## 2020-12-03 LAB — BASIC METABOLIC PANEL
BUN: 16 mg/dL (ref 6–23)
CO2: 30 mEq/L (ref 19–32)
Calcium: 9.9 mg/dL (ref 8.4–10.5)
Chloride: 100 mEq/L (ref 96–112)
Creatinine, Ser: 0.76 mg/dL (ref 0.40–1.20)
GFR: 86.41 mL/min (ref >60.00)
Glucose, Bld: 87 mg/dL (ref 70–99)
Potassium: 3.9 mEq/L (ref 3.5–5.1)
Sodium: 138 mEq/L (ref 135–145)

## 2020-12-03 LAB — TSH: TSH: 2.87 u[IU]/mL (ref 0.35–4.50)

## 2020-12-03 LAB — VITAMIN B12: Vitamin B-12: 744 pg/mL (ref 211–911)

## 2020-12-03 LAB — VITAMIN D 25 HYDROXY (VIT D DEFICIENCY, FRACTURES): VITD: 35.66 ng/mL (ref 30.00–100.00)

## 2020-12-03 LAB — LDL CHOLESTEROL, DIRECT: Direct LDL: 131 mg/dL

## 2020-12-03 MED ORDER — CIPROFLOXACIN HCL 500 MG PO TABS: 500.0000 mg | ORAL_TABLET | Freq: Two times a day (BID) | ORAL | 0 refills | Status: DC

## 2020-12-03 MED ORDER — SOLIFENACIN SUCCINATE 5 MG PO TABS: 5.0000 mg | ORAL_TABLET | Freq: Every day | ORAL | 3 refills | Status: DC

## 2020-12-03 NOTE — Patient Instructions (Addendum)
Please take all new medication as prescribed - the antibiotic if the urine culture is abnormal  Please take all new medication as prescribed - the vesicare if covered by your insurance  We have discussed the Cardiac CT Score test to measure the calcification level (if any) in your heart arteries.  This test has been ordered in our [facility], so please call [facility] directly, as they prefer this, at [phone] to be scheduled.  If the CT cardiac score is elevated especially more than 100, you may want to start a statin  Please continue to monitor your BP at home with a goal to be at least less than 140/90  Please continue all other medications as before, and refills have been done if requested.  Please have the pharmacy call with any other refills you may need.  Please continue your efforts at being more active, low cholesterol diet, and weight control.  You are otherwise up to date with prevention measures today.  Please keep your appointments with your specialists as you may have planned  Please go to the LAB at the blood drawing area for the tests to be done  You will be contacted by phone if any changes need to be made immediately.  Otherwise, you will receive a letter about your results with an explanation, but please check with MyChart first.  Please remember to sign up for MyChart if you have not done so, as this will be important to you in the future with finding out test results, communicating by private email, and scheduling acute appointments online when needed.  Please make an Appointment to return for your 1 year visit, or sooner if needed, with Lab testing by Appointment as well, to be done about 3-5 days before at the [facility] (so this is for TWO appointments - please see the scheduling desk as you leave)  Due to the ongoing Covid 19 pandemic, our lab now requires an appointment for any labs done at our office.  If you need labs done and do not have an  appointment, please call our office ahead of time to schedule before presenting to the lab for your testing.

## 2020-12-03 NOTE — Progress Notes (Signed)
Patient ID: [name], female   DOB: September 24, 1961, 60 y.o.   MRN: [id number]         Chief Complaint:: wellness exam and htn uncontrolled, hld, dysuria, urinary frequency, low vit d       HPI:  [name] is a 60 y.o. female here for wellness exam, plans to have covid booster soon. Had covid infection in oct 2020 - no symptoms at the time, only pos test  O/w up to date with preventive referrals and immunizations  Declines tdap, ok for hep c screening, plans to f/u yearly mammogram and pap soon.                  Also has elevated bp today - has been < 140/90 at home.  Pt denies chest pain, increased sob or doe, wheezing, orthopnea, PND, increased LE swelling, palpitations, dizziness or syncope.  Declines med change today.  Not interested in statin for how, wants to try lower chol diet but also will do card ct scoring and consider statin if abnormal.  Also c/o 3 days onset dysuria, but also several months of urinary freqeucy without other urinary symptoms such aa urgency, flank pain, hematuria or n/v, fever, chills.  Not taking vit d  Wt Readings from Last 3 Encounters:  12/03/20 151 lb (68.5 kg)  09/08/20 152 lb (68.9 kg)  10/08/19 152 lb 6.4 oz (69.1 kg)   BP Readings from Last 3 Encounters:  12/03/20 (!) 142/90  09/08/20 120/76  10/08/19 (!) 144/82   Immunization History  Administered Date(s) Administered  . Influenza,inj,Quad PF,6+ Mos 08/08/2018, 06/24/2019, 06/22/2020  . PFIZER(Purple Top)SARS-COV-2 Vaccination 12/15/2019, 01/07/2020  . Zoster Recombinat (Shingrix) 07/08/2019, 09/09/2019   Health Maintenance Due  Topic Date Due  . Hepatitis C Screening  Never done      Past Medical History:  Diagnosis Date  . Arthritis   . Cancer [facility])    skin cancer chest  . Cervical high risk HPV (human papillomavirus) test positive 11/2012   Normal cytology recommend repeat Pap with HPV one year  . LGSIL (low grade squamous intraepithelial dysplasia)    07/2001  . Urinary incontinence     [facility]   Past Surgical History:  Procedure Laterality Date  . COLPOSCOPY    . HAND SURGERY  2012   RIGHT HAND MIDDLE FINGER  . LASIK    . MOUTH SURGERY  1981  . TONSILLECTOMY AND ADENOIDECTOMY      reports that she has never smoked. She has never used smokeless tobacco. She reports current alcohol use of about 5.0 standard drinks of alcohol per week. She reports that she does not use drugs. family history includes Cancer in her father; Diabetes in her father; Hypertension in her father and mother; Kidney disease in her father; Seizures in her mother; Stroke in her maternal grandmother and mother. Allergies  Allergen Reactions  . Penicillins Rash    Rash, hives    Current Outpatient Medications on File Prior to Visit  Medication Sig Dispense Refill  . calcium citrate-vitamin D (CITRACAL+D) 315-200 MG-UNIT per tablet Take 1 tablet by mouth 2 (two) times daily.    . fish oil-omega-3 fatty acids 1000 MG capsule Take 720 mg by mouth daily.    . Multiple Vitamin (MULTIVITAMIN) tablet Take 1 tablet by mouth daily.    . psyllium (METAMUCIL) 58.6 % powder Take 1 packet by mouth 3 (three) times daily.    . TURMERIC PO Take by mouth.    Marland Kitchen VITAMIN  D PO Take by mouth.    . zolpidem (AMBIEN) 5 MG tablet Take 1 tablet (5 mg total) by mouth at bedtime as needed for sleep. 30 tablet 5   No current facility-administered medications on file prior to visit.        ROS:  All others reviewed and negative.  Objective        PE:  BP (!) 142/90   Pulse 78   Ht 5\' 1"  (1.549 m)   Wt 151 lb (68.5 kg)   SpO2 97%   BMI 28.53 kg/m                 Constitutional: Pt appears in NAD               HENT: Head: NCAT.                Right Ear: External ear normal.                 Left Ear: External ear normal.                Eyes: . Pupils are equal, round, and reactive to light. Conjunctivae and EOM are normal               Nose: without d/c or deformity               Neck: Neck supple. Gross normal ROM                Cardiovascular: Normal rate and regular rhythm.                 Pulmonary/Chest: Effort normal and breath sounds without rales or wheezing.                Abd:  Soft, NT, ND, + BS, no organomegaly               Neurological: Pt is alert. At baseline orientation, motor grossly intact               Skin: Skin is warm. No rashes, no other new lesions, LE edema - none               Psychiatric: Pt behavior is normal without agitation   Micro: none  Cardiac tracings I have personally interpreted today:  none  Pertinent Radiological findings (summarize): none   Lab Results  Component Value Date   WBC 9.8 12/03/2020   HGB 15.3 (H) 12/03/2020   HCT 44.5 12/03/2020   PLT 262.0 12/03/2020   GLUCOSE 87 12/03/2020   CHOL 215 (H) 12/03/2020   TRIG 321.0 (H) 12/03/2020   HDL 46.30 12/03/2020   LDLDIRECT 131.0 12/03/2020   LDLCALC 119 (H) 10/08/2019   ALT 16 12/03/2020   AST 17 12/03/2020   NA 138 12/03/2020   K 3.9 12/03/2020   CL 100 12/03/2020   CREATININE 0.76 12/03/2020   BUN 16 12/03/2020   CO2 30 12/03/2020   TSH 2.87 12/03/2020   Assessment/Plan:  [name] is a 60 y.o. White or Caucasian [1] female with  has a past medical history of Arthritis, Cancer ([place]), Cervical high risk HPV (human papillomavirus) test positive (11/2012), LGSIL (low grade squamous intraepithelial dysplasia), and Urinary incontinence.  Encounter for well adult exam with abnormal findings Age and sex appropriate education and counseling updated with regular exercise and diet Referrals for preventative services - for hep c screen, pt to call for pap, mammogram Immunizations addressed -  deciens tdap, for covid booster soon Smoking counseling  - none needed Evidence for depression or other mood disorder - none significant Most recent labs reviewed. I have personally reviewed and have noted: 1) the patient's medical and social history 2) The patient's current medications and supplements 3) The  patient's height, weight, and BMI have been recorded in the chart   Blood pressure elevated without history of HTN Uncontrolled, today, pt declines med change for now, to work on low na diet, wt control, and f/u bp at home and next visit  Dysuria ? uti - for antibiotic pending culture  HLD (hyperlipidemia) Lab Results  Component Value Date   LDLCALC 119 (H) 10/08/2019   Goal ldl < 100, declines statin for now but will reconsder for abnoraml CT card scrore   Urinary frequency Suspect probable OAB - for urine culture r/r/o curent infection, but also vesicare 5 qd  Vitamin D deficiency Last vitamin D Lab Results  Component Value Date   VD25OH 35.66 12/03/2020   Stable, cont oral replacement'  Followup: Return in about 1 year (around 12/03/2021).  Cathlean Cower, MD 12/05/2020 4:14 PM [facility]

## 2020-12-05 ENCOUNTER — Encounter: Payer: Self-pay | Admitting: Internal Medicine

## 2020-12-05 NOTE — Assessment & Plan Note (Signed)
Last vitamin D Lab Results  Component Value Date   VD25OH 35.66 12/03/2020   Stable, cont oral replacement'

## 2020-12-05 NOTE — Assessment & Plan Note (Signed)
?   uti - for antibiotic pending culture

## 2020-12-05 NOTE — Assessment & Plan Note (Signed)
Uncontrolled, today, pt declines med change for now, to work on low na diet, wt control, and f/u bp at home and next visit

## 2020-12-05 NOTE — Assessment & Plan Note (Signed)
Suspect probable OAB - for urine culture r/r/o curent infection, but also vesicare 5 qd

## 2020-12-05 NOTE — Assessment & Plan Note (Signed)
Age and sex appropriate education and counseling updated with regular exercise and diet Referrals for preventative services - for hep c screen, pt to call for pap, mammogram Immunizations addressed - deciens tdap, for covid booster soon Smoking counseling  - none needed Evidence for depression or other mood disorder - none significant Most recent labs reviewed. I have personally reviewed and have noted: 1) the patient's medical and social history 2) The patient's current medications and supplements 3) The patient's height, weight, and BMI have been recorded in the chart

## 2020-12-05 NOTE — Assessment & Plan Note (Signed)
Lab Results  Component Value Date   LDLCALC 119 (H) 10/08/2019   Goal ldl < 100, declines statin for now but will reconsder for abnoraml CT card scrore

## 2020-12-06 ENCOUNTER — Encounter: Payer: Self-pay | Admitting: Internal Medicine

## 2020-12-06 LAB — URINE CULTURE: Result:: NO GROWTH

## 2020-12-06 LAB — HIV ANTIBODY (ROUTINE TESTING W REFLEX): HIV 1&2 Ab, 4th Generation: NONREACTIVE

## 2020-12-06 LAB — HEPATITIS C ANTIBODY
Hepatitis C Ab: NONREACTIVE
SIGNAL TO CUT-OFF: 0.01 (ref <1.00)

## 2020-12-07 ENCOUNTER — Encounter: Payer: Self-pay | Admitting: Nurse Practitioner

## 2020-12-07 ENCOUNTER — Other Ambulatory Visit: Payer: Self-pay

## 2020-12-07 ENCOUNTER — Ambulatory Visit: Payer: Federal, State, Local not specified - PPO | Admitting: Nurse Practitioner

## 2020-12-07 VITALS — BP 120/76 | HR 84 | Temp 98.2°F | Resp 16 | Wt 151.0 lb

## 2020-12-07 DIAGNOSIS — R102 Pelvic and perineal pain: Secondary | ICD-10-CM | POA: Diagnosis not present

## 2020-12-07 DIAGNOSIS — R350 Frequency of micturition: Secondary | ICD-10-CM

## 2020-12-07 LAB — URINALYSIS, COMPLETE W/RFL CULTURE
Bacteria, UA: NONE SEEN /HPF
Bilirubin Urine: NEGATIVE
Glucose, UA: NEGATIVE
Hyaline Cast: NONE SEEN /LPF
Ketones, ur: NEGATIVE
Leukocyte Esterase: NEGATIVE
Nitrites, Initial: NEGATIVE
Protein, ur: NEGATIVE
RBC / HPF: NONE SEEN /HPF (ref 0–2)
Specific Gravity, Urine: 1.015 (ref 1.001–1.03)
WBC, UA: NONE SEEN /HPF (ref 0–5)
pH: 7.5 (ref 5.0–8.0)

## 2020-12-07 LAB — NO CULTURE INDICATED

## 2020-12-07 LAB — WET PREP FOR TRICH, YEAST, CLUE

## 2020-12-07 NOTE — Patient Instructions (Addendum)
Prelief OTC  Interstitial Cystitis  Interstitial cystitis is inflammation of the bladder. This condition is also known as painful bladder syndrome. This may cause pain in the bladder area as well as a frequent and urgent need to urinate. The bladder is an organ that stores urine after the urine is made in the kidneys. The severity of interstitial cystitis can vary from person to person. You may have flare-ups, and then your symptoms may go away for a while. For many people, it becomes a long-term (chronic) problem. What are the causes? The cause of this condition is not known. What increases the risk? The following factors may make you more likely to develop this condition:  You are female.  You have fibromyalgia.  You have irritable bowel syndrome (IBS).  You have endometriosis. This condition may be aggravated by:  Stress.  Smoking.  Spicy foods. What are the signs or symptoms? Symptoms of interstitial cystitis vary, and they can change over time. Symptoms may include:  Discomfort or pain in the bladder area, which is in the lower abdomen. Pain can range from mild to severe. The pain may change in intensity as the bladder fills with urine or as it empties.  Pain in the pelvic area, between the hip bones.  An urgent need to urinate.  Frequent urination.  Pain during urination.  Pain during sex.  Blood in the urine.  Fatigue. For women, symptoms often get worse during menstruation. How is this diagnosed? This condition is diagnosed based on your symptoms, your medical history, and a physical exam. You may have tests to rule out other conditions, such as:  Urine tests.  Cystoscopy. For this test, a tool similar to a very thin telescope is used to look into your bladder.  Biopsy. This involves taking a sample of tissue from the bladder to be examined under a microscope. How is this treated? There is no cure for this condition, but treatment can help you control your  symptoms. Work closely with your health care provider to find the most effective treatments for you. Treatment options may include:  Medicines to relieve pain and reduce how often you feel the need to urinate. This treatment may include: ? A procedure where a small amount of medicine that eases irritation is put inside your bladder through a catheter (bladder instillation).  Lifestyle changes, such as changing your diet or taking steps to control stress.  Physical therapy. This may include: ? Exercises to help relax the pelvic floor muscles. ? Massage to relax tight muscles (myofascial release).  Learning ways to control when you urinate (bladder training).  Using a device that provides electrical stimulation to your nerves, which can relieve pain (neuromodulation therapy). The device is placed on your back, where it blocks the nerves that cause you to feel pain in your bladder area.  A procedure that stretches your bladder by filling it with air or fluid (hydrodistention).  Surgery. This is rare. It is only done for extreme cases, if other treatments do not help. Follow these instructions at home: Lifestyle  Learn and practice relaxation techniques, such as deep breathing and muscle relaxation.  Get care for your body and mental well-being, such as: ? Cognitive behavioral therapy (CBT). This therapy changes the way you think or act in response to the fatigue. This may help improve how you feel. ? Seeing a mental health therapist to evaluate and treat depression, if necessary.  Work with your health care provider on other ways to manage  pain. Acupuncture may be helpful.  Avoid drinking alcohol.  Do not use any products that contain nicotine or tobacco, such as cigarettes, e-cigarettes, and chewing tobacco. If you need help quitting, ask your health care provider. Eating and drinking  Make dietary changes as recommended by your health care provider. You may need to avoid: ? Spicy  foods. ? Foods that contain a lot of potassium.  Limit your intake of drinks that make you need to urinate. These include: ? Caffeinated drinks like soda, coffee, and tea. ? Alcohol. Bladder training  Use bladder training techniques as directed. Techniques may include: ? Urinating at scheduled times. ? Training yourself to delay urination.  Keep a bladder diary. ? Write down the times that you urinate and any symptoms that you have. This can help you find out which foods, liquids, or activities make your symptoms worse. ? Use your bladder diary to schedule bathroom trips. If you are away from home, plan to be near a bathroom at each of your scheduled times.  Make sure that you urinate just before you leave the house and just before you go to bed.   General instructions  Take over-the-counter and prescription medicines only as told by your health care provider.  You can try a warm or cool compress over your bladder for comfort.  Avoid wearing tight clothing.  Do exercises to relax your pelvic floor muscles as told by your physical therapist.  Keep all follow-up visits as told by your health care provider. This is important. Where to find more information To find more information or a support group near you, visit:  [employer]: urologyhealth.org  Interstitial Cystitis Association: ClassPreviews.com.br Contact a health care provider if you have:  Symptoms that do not get better with treatment.  Pain or discomfort that gets worse.  More frequent urges to urinate.  A fever. Get help right away if:  You have no control over when you urinate. Summary  Interstitial cystitis is inflammation of the bladder.  This condition may cause pain in the bladder area as well as a frequent and urgent need to urinate.  You may have flare-ups of the condition, and then it may go away for a while. For many people, it becomes a long-term (chronic) problem.  There is no cure for  interstitial cystitis, but treatment methods are available to control your symptoms. This information is not intended to replace advice given to you by your health care provider. Make sure you discuss any questions you have with your health care provider. Document Revised: 11/05/2019 Document Reviewed: 08/13/2017 Elsevier Patient Education  2021 [place] for Interstitial Cystitis Interstitial cystitis (IC) is a long-term (chronic) condition that causes pain and pressure in the bladder, the lower abdomen, and the pelvic area. Other symptoms of IC include urinary urgency and frequency. Symptoms tend to come and go. Many people with IC find that certain foods trigger their symptoms. Different foods may be problematic for different people. Some foods are more likely to cause symptoms than others. Learning which foods bother you and which do not can help you come up with an eating plan to manage IC. What are tips for following this plan? You may find it helpful to work with a dietitian. This health care provider can help you develop your eating plan by doing an elimination diet, which involves these steps:  Start with a list of foods that you think trigger your IC symptoms along with the  foods that most commonly trigger symptoms for many people with IC.  Eliminate those foods from your diet for about one month, then start reintroducing the foods one at a time to see which ones trigger your symptoms.  Make a list of the foods that trigger your symptoms. It may take several months to find out which foods bother you. Reading food labels Once you know which foods trigger your IC symptoms, you can avoid them. However, it is also a good idea to read food labels because some foods that trigger your symptoms may be included as ingredients in other foods. These ingredients may include:  Chili peppers.  Tomato products.  Soy.  Worcestershire sauce.  Vinegar.  Alcohol.  Citrus  flavors or juices.  Artificial sweeteners.  Monosodium glutamate. Shopping  Shopping can be a challenge if many foods trigger your IC. When you go grocery shopping, bring a list of the foods you can eat.  You can get an app for your phone that lets you know which foods are the safest and which you may want to avoid. You can find the app at the Interstitial Cystitis Network website: www.ic-network.com Meal planning  Plan your meals according to the results of your elimination diet. If you have not done an elimination diet, plan meals according to IC food lists recommended by your health care provider or dietitian. These lists tell you which foods are least and most likely to cause symptoms.  Avoid certain types of food when you go out to eat, such as pizza and foods typically served at [place], [place], and [place]. These foods often contain ingredients that can aggravate IC. General information Here are some general guidelines for an IC eating plan:  Do not eat large portions.  Drink plenty of fluids with your meals.  Do not eat foods that are high in sugar, salt, or saturated fat.  Choose whole fruits instead of juice.  Eat a colorful variety of vegetables.   What foods should I eat? For people with IC, the best diet is a balanced one that includes things from all the food groups. Even if you have to avoid certain foods, there are still plenty of healthy choices in each group. The following are some foods that are least bothersome and may be safest to eat: Fruits Bananas. Blueberries and blueberry juice. Melons. Pears. Apples. Dates. Prunes. Raisins. Apricots. Vegetables Asparagus. Avocado. Celery. Beets. Bell peppers. Black olives. Broccoli. Brussels sprouts. Cabbage. Carrots. Cauliflower. Cucumber. Eggplant. Green beans. Potatoes. Radishes. Spinach. Squash. Turnips. Zucchini. Mushrooms. Peas. Grains Oats. Rice. Bran. Oatmeal. Whole wheat bread. Meats and other  proteins Beef. Fish and other seafood. Eggs. Nuts. Peanut butter. Pork. Poultry. Lamb. Garbanzo beans. Pinto beans. Dairy Whole or low-fat milk. American, mozzarella, mild cheddar, feta, ricotta, and cream cheeses. The items listed above may not be a complete list of foods and beverages you can eat. Contact a dietitian for more information. What foods should I avoid? You should avoid any foods that seem to trigger your symptoms. It is also a good idea to avoid foods that are most likely to cause symptoms in many people with IC. These include the following: Fruits Citrus fruits, including lemons, limes, oranges, and grapefruit. Cranberries. Strawberries. Pineapple. Kiwi. Vegetables Chili peppers. Onions. Sauerkraut. Tomato and tomato products. Angie Fava. Grains You do not need to avoid any type of grain unless it triggers your symptoms. Meats and other proteins Precooked or cured meats, such as sausages or meat loaves. Soy products. Dairy Chocolate  ice cream. Processed cheese. Yogurt. Beverages Alcohol. Chocolate drinks. Coffee. Cranberry juice. Carbonated drinks. Tea (black, green, or herbal). Tomato juice. Sports drinks. The items listed above may not be a complete list of foods and beverages you should avoid. Contact a dietitian for more information. Summary  Many people with IC find that certain foods trigger their symptoms. Different foods may be problematic for different people. Some foods are more likely to cause symptoms than others.  You may find it helpful to work with a dietitian to do an elimination diet and come up with an eating plan that is right for you.  Plan your meals according to the results of your elimination diet. If you have not done an elimination diet, plan your meals using IC food lists. These lists tell you which foods are least and most likely to cause symptoms.  The best diet for people with IC is a balanced diet that includes foods from all the food groups.  Even if you have to avoid certain foods, there are still plenty of healthy choices in each group. This information is not intended to replace advice given to you by your health care provider. Make sure you discuss any questions you have with your health care provider. Document Revised: 01/09/2019 Document Reviewed: 05/23/2018 Elsevier Patient Education  2021 [place].  Genitourinary Syndrome of Menopause (GSM)  Genitourinary Syndrome of menopause is a term that describes the spectrum of changes caused by the lack of estrogen in menopause. Common Signs and Symptoms Include: Vaginal dryness, irritation/burning/itching. Abnormal discharge, vaginal pressure, pain with sex, decreased sexual arousal/desire, difficulty achieving orgasm, pain with urination, urgency, incontinence of urine, recurrent bladder infections, urethral prolapse and others. Diagnosis can usually be made with history and pelvic exam. Other testing may be considered to rule out other potential abnormalities. Symptoms can be progressive and chronic. The goal of treatment is symptom relief.  There are several different approaches to improving symptoms:  Vaginal Moisturizers and lubricants: Glycerin Moisturizer (Replens), Silicone based products (Uberlube), water-based products (Restore Moisturizing Gel by [employer]), Hyaluronic Acid vaginal products (such as HYALO GYN), and natural oils such as Olive Oil, Almond Oil and Coconut Oil (Since coconut oil comes in solid preparations, you can form them into suppositories and insert into the vagina as needed). These products are Over-the-Counter (OTC) at [employer] and retail stores and DO NOT contain hormones.  How to use vaginal and vulvar moisturizers . Many vaginal moisturizers come with an applicator. You will need fill the applicator with the moisturizer and then insert it carefully into your vagina. You can put lubricant on the tip of the applicator to make it easier to  insert into your vagina. . You can also use vaginal moisturizers on your vulvar tissues, including your inner and outer labia (the folds of skin around your vagina). To put these moisturizers on your vulva, put a small amount (pea or grape size) of moisturizer on your finger. Then, massage the moisturizer into your vaginal opening and onto your labia. . If you recently finished cancer treatment, or are going through sudden menopause, you may need to use the moisturizers 3 to 5 times a week to relieve your symptoms. . Vaginal and vulvar moisturizers should be used before you go to bed, so the product can be fully absorbed.  Lifestyle modifications: smoking cessation, pelvic floor physical therapy, Kegel's exercises, vaginal dilators  Non-hormonal therapies: Lidocaine (topical anesthetic) to decrease sensitivity, Oral Ospemifeme (requires prescription), Laser therapy (Pros and Cons should  be discussed with provider)  Hormones Therapies: For moderate to severe GSM, vaginal estrogen is considered the most effective treatment. It can be used in combination with vaginal moisturizers and lubricants. Estrogen is delivered in small doses in the vagina with various preparations available including creams, rings, and tablets. Estrogen therapy may be contraindicated with certain hormone sensitive cancers. Vaginal DHEA and Testosterone have shown effectiveness in some cases to help with relieving vaginal atrophy and have shown mixed results with libido. Risks and benefits should be discussed prior to starting therapy.

## 2020-12-07 NOTE — Progress Notes (Signed)
GYNECOLOGY  VISIT  CC:   Pain and frequent urination  HPI: 60 y.o. G76P2012 Married White or Caucasian female here for "uti".  Is currently taking Cipro, it does not help. Saw Dr. Jenny Reichmann last week. Urine culture came back negative.  Urinates frequently, painful, tingling sensation, shooting pains come and go off to the left side. Frequency as been going on for years but has had increased pain since end of February. Reports a feeling of "rubbed raw" in the vulva. Wears a pad all the time. Not sexually active with husband right now.  Feels like she urinates every 1/2 hour, gets up about twice at night.  Denies vaginal discharge.  No change in bowel movement Denies nausea/vomiting Hx frequent urinary tract infections, but hasn't had one in the last couple years.      GYNECOLOGIC HISTORY: Patient's last menstrual period was 07/29/2018. Contraception: post menopausal Menopausal hormone therapy: none  Patient Active Problem List   Diagnosis Date Noted  . Vitamin D deficiency 12/03/2020  . Dysuria 12/03/2020  . Urinary frequency 12/03/2020  . COVID-19 virus infection 10/08/2019  . Hypertriglyceridemia 10/08/2019  . Encounter for well adult exam with abnormal findings 10/08/2019  . HLD (hyperlipidemia) 10/08/2019  . Blood pressure elevated without history of HTN 10/08/2019    Past Medical History:  Diagnosis Date  . Arthritis   . Cancer [facility])    skin cancer chest  . Cervical high risk HPV (human papillomavirus) test positive 11/2012   Normal cytology recommend repeat Pap with HPV one year  . LGSIL (low grade squamous intraepithelial dysplasia)    07/2001  . Urinary incontinence    [facility]    Past Surgical History:  Procedure Laterality Date  . COLPOSCOPY    . HAND SURGERY  2012   RIGHT HAND MIDDLE FINGER  . LASIK    . MOUTH SURGERY  1981  . TONSILLECTOMY AND ADENOIDECTOMY      MEDS:   Current Outpatient Medications on File Prior to Visit  Medication Sig Dispense Refill   . calcium citrate-vitamin D (CITRACAL+D) 315-200 MG-UNIT per tablet Take 1 tablet by mouth 2 (two) times daily.    . ciprofloxacin (CIPRO) 500 MG tablet Take 1 tablet (500 mg total) by mouth 2 (two) times daily for 10 days. 20 tablet 0  . fish oil-omega-3 fatty acids 1000 MG capsule Take 720 mg by mouth daily.    . Multiple Vitamin (MULTIVITAMIN) tablet Take 1 tablet by mouth daily.    . psyllium (METAMUCIL) 58.6 % powder Take 1 packet by mouth 3 (three) times daily.    . solifenacin (VESICARE) 5 MG tablet Take 1 tablet (5 mg total) by mouth daily. 90 tablet 3  . TURMERIC PO Take by mouth.    Marland Kitchen VITAMIN D PO Take by mouth.    . zolpidem (AMBIEN) 5 MG tablet Take 1 tablet (5 mg total) by mouth at bedtime as needed for sleep. 30 tablet 5   No current facility-administered medications on file prior to visit.    ALLERGIES: Penicillins  Family History  Problem Relation Age of Onset  . Hypertension Mother   . Stroke Mother   . Seizures Mother   . Diabetes Father   . Hypertension Father   . Cancer Father        SKIN  . Kidney disease Father   . Stroke Maternal Grandmother   . Colon cancer Neg Hx     Review of Systems  Genitourinary:  Shooting pelvic pain, pain with urination, back pain, vaginal irritation    PHYSICAL EXAMINATION:    BP 120/76   Pulse 84   Temp 98.2 F (36.8 C) (Oral)   Resp 16   Wt 151 lb (68.5 kg)   LMP 07/29/2018   BMI 28.53 kg/m     General appearance: alert, cooperative, no acute distress Abdomen: soft,  no masses,  no organomegaly, mild tenderness suprapubic and left lower quadrant Neg CVAT Lymph:  no inguinal LAD noted  Pelvic: External genitalia:  no lesions, thin tissue              Urethra:  normal appearing urethra with no masses, tenderness or lesions              Bartholins and Skenes: normal                 Vagina: normal appearing vagina, ph 4.5, thin tissue              Cervix: no cervical motion tenderness and no lesions               Bimanual Exam:   Uterus:    Normal size and shape, mild tenderness  Adnexa: no palpable abnormality, non tender               Chaperone, Joy, CMA, was present for exam.  Assessment: Suprapubic pain/Frequent Uriantion - Plan: Urinalysis,Complete w/RFL Culture, Ambulatory referral to Urogynecology, [facility], [place], CLUE  Reassured wet mount normal and urinalysis not indicative of bladder infection. Advised that urinating every 30 min is not normal and there are options. Will plan referral to URO/GYN  Advised that vulva irritation may be r/t thinning tissue. May use Vaseline, A&D ointment, coconut oil or vaginal lubricants/mositurizers (samples of Uber lube provided)  Discussed possible interstitial cystitis. Discussed IC diet and trial of use of Prelief OTC  Advised Vesicare unlikely to help as pt does not have urge incontinence.   30 minutes of total time was spent for this patient encounter, including preparation, face-to-face counseling with the patient and coordination of care, and documentation of the encounter.

## 2020-12-09 NOTE — Progress Notes (Addendum)
[facility] New Patient Evaluation and Consultation  Referring Provider: Karma Ganja, NP PCP: Biagio Borg, MD Date of Service: 12/13/2020  SUBJECTIVE Chief Complaint: New Patient (Initial Visit) (Referral for urinary frequency/pain)  History of Present Illness: [name] is a 60 y.o. White or Caucasian female seen in consultation at the request of NP Dixon for evaluation of pain and frequent urination.    Review of records from [employer] significant for: Having symptoms of UTI, treated with Cipro, but culture returned negative. Has symptoms of urinary frequency and dysuria. Also having vulvar pain. Wet mount negative.    Urinary Symptoms: Leaks urine with cough/ sneeze, laughing, exercise and jumping/ running Leaks 6-8 time(s) per days.  Pad use: 3-4 pads per day.   She is bothered by her UI symptoms.  Day time voids 16-18.  Nocturia: 1-2 times per night to void. Has a lot of urgency/ tingling sensation. Has been going on a few years.  Voiding dysfunction: she empties her bladder well.  does not use a catheter to empty bladder.  When urinating, she feels dribbling after finishing and the need to urinate multiple times in a row Drinks: 7oz coke, coffee mug of tea, 6-8 24oz bottles water per day, 2-3 days a week will have a beer. Bothered by caffeine so tries to avoid.  Prescribed vesicare- took for only 4 days and caused dry mouth and dry eyes.   UTIs: 0 UTI's in the last year.   Denies history of blood in urine and kidney or bladder stones   She kept a bladder diary:      Pelvic Organ Prolapse Symptoms:                  She Denies a feeling of a bulge the vaginal area.   Bowel Symptom: Bowel movements: 1-2 time(s) per day Stool consistency: hard or soft  Straining: no.  Splinting: no.  Incomplete evacuation: no.  She Denies accidental bowel leakage / fecal incontinence Bowel regimen: fiber Last colonoscopy: Date- within last 5 years, Results  negative  Sexual Function Sexually active: no- husband unable Sexual orientation: heterosexual Pain with sex: Yes, at the vaginal opening  Pelvic Pain Denies pelvic pain   Past Medical History:  Past Medical History:  Diagnosis Date  . Arthritis   . Cancer [facility])    skin cancer chest  . Cervical high risk HPV (human papillomavirus) test positive 11/2012   Normal cytology recommend repeat Pap with HPV one year  . LGSIL (low grade squamous intraepithelial dysplasia)    07/2001  . Urinary incontinence    USI     Past Surgical History:   Past Surgical History:  Procedure Laterality Date  . COLPOSCOPY    . HAND SURGERY  2012   RIGHT HAND MIDDLE FINGER  . LASIK    . MOUTH SURGERY  1981  . TONSILLECTOMY AND ADENOIDECTOMY       Past OB/GYN History: G3 P2 Vaginal deliveries: 1,  Forceps/ Vacuum deliveries: 1, Cesarean section: 0 Menopausal: Yes, at age 87, Denies vaginal bleeding since menopause Last pap smear was Dec 2021- negative.  Any history of abnormal pap smears: no.   Medications: She has a current medication list which includes the following prescription(s): calcium citrate-vitamin d, fish oil-omega-3 fatty acids, multivitamin, psyllium, trospium chloride, turmeric, and zolpidem.   Allergies: Patient is allergic to penicillins.   Social History:  Social History   Tobacco Use  . Smoking status: Never Smoker  . Smokeless  tobacco: Never Used  Vaping Use  . Vaping Use: Never used  Substance Use Topics  . Alcohol use: Yes    Alcohol/week: 2.0 - 3.0 standard drinks    Types: 2 - 3 Cans of beer per week    Comment: 2-3 BEERS FOR WEEK  . Drug use: No    Relationship status: married She lives with husband.   She is employed as an Forensic psychologist. Regular exercise: Yes: daily burn every day, and walking  Family History:   Family History  Problem Relation Age of Onset  . Hypertension Mother   . Stroke Mother   . Seizures Mother   . Diabetes Father   .  Hypertension Father   . Cancer Father        SKIN  . Kidney disease Father   . Kidney Stones Father   . Stroke Maternal Grandmother   . Kidney Stones Sister   . Kidney Stones Brother   . Colon cancer Neg Hx      Review of Systems: Review of Systems  Constitutional: Negative for fever, malaise/fatigue and weight loss.  Respiratory: Negative for cough, shortness of breath and wheezing.   Cardiovascular: Negative for chest pain, palpitations and leg swelling.  Gastrointestinal: Negative for abdominal pain and blood in stool.  Genitourinary: Negative for dysuria.  Musculoskeletal: Negative for myalgias.  Skin: Negative for rash.  Neurological: Negative for dizziness and headaches.  Endo/Heme/Allergies: Does not bruise/bleed easily.  Psychiatric/Behavioral: Negative for depression. The patient is not nervous/anxious.      OBJECTIVE Physical Exam: Vitals:   12/13/20 1118  BP: (!) 154/84  Pulse: 74  Weight: 150 lb (68 kg)  Height: 5' 1.5" (1.562 m)    Physical Exam Constitutional:      General: She is not in acute distress. Pulmonary:     Effort: Pulmonary effort is normal.  Abdominal:     General: There is no distension.  Musculoskeletal:        General: No swelling. Normal range of motion.  Skin:    General: Skin is warm and dry.     Findings: No rash.  Neurological:     Mental Status: She is alert and oriented to person, place, and time.  Psychiatric:        Mood and Affect: Mood normal.        Behavior: Behavior normal.     GU / Detailed Urogynecologic Evaluation:  Pelvic Exam: Normal external female genitalia; Bartholin's and Skene's glands normal in appearance; urethral meatus normal in appearance, no urethral masses or discharge.   CST: negative  Speculum exam reveals normal vaginal mucosa with atrophy. Cervix normal appearance. Uterus normal single, nontender. Adnexa no mass, fullness, tenderness.     Pelvic floor strength IV/V  Pelvic floor  musculature: Right levator non-tender, Right obturator non-tender, Left levator non-tender, Left obturator non-tender  POP-Q:   POP-Q  -2                                            Aa   -2                                           Ba  -8  C   4                                            Gh  3                                            Pb  10                                            tvl   -2                                            Ap  -2                                            Bp  -9.5                                              D     Rectal Exam:  Normal external rectum  Post-Void Residual (PVR) by Bladder Scan: In order to evaluate bladder emptying, we discussed obtaining a postvoid residual and she agreed to this procedure.  Procedure: The ultrasound unit was placed on the patient's abdomen in the suprapubic region after the patient had voided. A PVR of 5 ml was obtained by bladder scan.  Laboratory Results: POC urine: trace blood   I visualized the urine specimen, noting the specimen to be clear yellow  ASSESSMENT AND PLAN Ms. Kentner is a 60 y.o. with:  1. Overactive bladder   2. Urinary frequency   3. SUI (stress urinary incontinence, female)   4. Vaginal atrophy    1. OAB We discussed the symptoms of overactive bladder (OAB), which include urinary urgency, urinary frequency, nocturia, with or without urge incontinence.  While we do not know the exact etiology of OAB, several treatment options exist. We discussed management including behavioral therapy (decreasing bladder irritants, urge suppression strategies, timed voids, bladder retraining), physical therapy, medication; for refractory cases posterior tibial nerve stimulation, sacral neuromodulation, and intravesical botulinum toxin injection.  For anticholinergic medications, we discussed the potential side effects of anticholinergics including dry eyes,  dry mouth, constipation, cognitive impairment and urinary retention. For Beta-3 agonist medication, we discussed the potential side effect of elevated blood pressure which is more likely to occur in individuals with uncontrolled hypertension. - Trospium 60mg  ER daily prescribed. Discussed trial of several weeks. If she has side effects or it is ineffective then we can move on to third line therapies.   2. SUI For treatment of stress urinary incontinence,  non-surgical options include expectant management, weight loss, physical therapy, as well as a pessary or vaginal muscle trainer.  She is not interested in surgical options - She will work on kegel exercises at home.   3.  Vaginal atrophy For symptomatic vaginal atrophy options include lubrication with a water-based lubricant, personal hygiene measures and barrier protection against wetness, and estrogen replacement in the form of vaginal cream, vaginal tablets, or a time-released vaginal ring.   - She will start with lubricant with coconut oil, and may be interested in vaginal estrogen therapy if this is not helpful.   Return in 6 weeks for follow up.   Jaquita Folds, MD   Medical Decision Making:  - Reviewed/ ordered a clinical laboratory test - Review and summation of prior records

## 2020-12-13 ENCOUNTER — Ambulatory Visit (INDEPENDENT_AMBULATORY_CARE_PROVIDER_SITE_OTHER): Payer: Federal, State, Local not specified - PPO | Admitting: Obstetrics and Gynecology

## 2020-12-13 ENCOUNTER — Encounter: Payer: Self-pay | Admitting: Obstetrics and Gynecology

## 2020-12-13 ENCOUNTER — Other Ambulatory Visit: Payer: Self-pay

## 2020-12-13 VITALS — BP 154/84 | HR 74 | Ht 61.5 in | Wt 150.0 lb

## 2020-12-13 DIAGNOSIS — R350 Frequency of micturition: Secondary | ICD-10-CM

## 2020-12-13 DIAGNOSIS — N393 Stress incontinence (female) (male): Secondary | ICD-10-CM

## 2020-12-13 DIAGNOSIS — N952 Postmenopausal atrophic vaginitis: Secondary | ICD-10-CM | POA: Diagnosis not present

## 2020-12-13 DIAGNOSIS — N3281 Overactive bladder: Secondary | ICD-10-CM | POA: Diagnosis not present

## 2020-12-13 LAB — POCT URINALYSIS DIPSTICK
Appearance: NORMAL
Bilirubin, UA: NEGATIVE
Glucose, UA: NEGATIVE
Ketones, UA: NEGATIVE
Leukocytes, UA: NEGATIVE
Nitrite, UA: NEGATIVE
Protein, UA: NEGATIVE
Spec Grav, UA: 1.01
Urobilinogen, UA: 0.2 U/dL
pH, UA: 6

## 2020-12-13 MED ORDER — TROSPIUM CHLORIDE ER 60 MG PO CP24: 1.0000 | ORAL_CAPSULE | Freq: Every day | ORAL | 5 refills | Status: DC

## 2020-12-13 NOTE — Patient Instructions (Signed)
We discussed the symptoms of overactive bladder (OAB), which include urinary urgency, urinary frequency, night-time urination, with or without urge incontinence.  We discussed management including behavioral therapy (decreasing bladder irritants by following a bladder diet, urge suppression strategies, timed voids, bladder retraining), physical therapy, medication; and for refractory cases posterior tibial nerve stimulation, sacral neuromodulation, and intravesical botulinum toxin injection.   For anticholinergic medications, we discussed the potential side effects of anticholinergics including dry eyes, dry mouth, constipation, rare risks of cognitive impairment and urinary retention. You were given Trospium 60mg  daily.   It can take a month to start working so give it time, but if you have bothersome side effects call sooner and we can try a different medication.  Call us if you have trouble filling the prescription or if it's not covered by your insurance.   For treatment of stress urinary incontinence, which is leakage with physical activity/movement/strainging/coughing, we discussed expectant management versus nonsurgical options versus surgery. Nonsurgical options include weight loss, physical therapy, as well as a pessary.  Surgical options include a midurethral sling, which is a synthetic mesh sling that acts like a hammock under the urethra to prevent leakage of urine, a Burch urethropexy, and transurethral injection of a bulking agent.  Vulvovaginal moisturizer Options: Marland Kitchen Vitamin E oil (pump or capsule) or cream (Gene's Vit E Cream) . Coconut oil . Silicone-based lubricant for use during intercourse ("wet platinum" is a brand available at most drugstores) . Crisco Consider the ingredients of the product - the fewer the ingredients the better!  Directions for Use: Clean and dry your hands Gently dab the vulvar/vaginal area dry as needed Apply a "pea-sized" amount of the moisturizer onto  your fingertip Using you other hand, open the labia  Apply the moisturizer to the vulvar/vaginal tissues Wear loose fitting underwear/clothing if possible following application Use moisturize up to 3 times daily as desired.

## 2020-12-17 ENCOUNTER — Ambulatory Visit: Payer: Federal, State, Local not specified - PPO

## 2020-12-22 DIAGNOSIS — Z0289 Encounter for other administrative examinations: Secondary | ICD-10-CM

## 2020-12-27 ENCOUNTER — Encounter: Payer: Self-pay | Admitting: Internal Medicine

## 2021-01-20 ENCOUNTER — Other Ambulatory Visit: Payer: Self-pay

## 2021-01-20 ENCOUNTER — Other Ambulatory Visit: Payer: Self-pay | Admitting: Internal Medicine

## 2021-01-20 ENCOUNTER — Encounter: Payer: Self-pay | Admitting: Internal Medicine

## 2021-01-20 ENCOUNTER — Ambulatory Visit (INDEPENDENT_AMBULATORY_CARE_PROVIDER_SITE_OTHER)
Admission: RE | Admit: 2021-01-20 | Discharge: 2021-01-20 | Disposition: A | Discharge status: Home / Self Care | Payer: Self-pay | Source: Ambulatory Visit | Attending: Internal Medicine | Admitting: Internal Medicine

## 2021-01-20 DIAGNOSIS — R030 Elevated blood-pressure reading, without diagnosis of hypertension: Secondary | ICD-10-CM

## 2021-01-20 MED ORDER — ATORVASTATIN CALCIUM 20 MG PO TABS: 20.0000 mg | ORAL_TABLET | Freq: Every day | ORAL | 3 refills | Status: DC

## 2021-01-26 ENCOUNTER — Ambulatory Visit: Payer: Federal, State, Local not specified - PPO | Admitting: Obstetrics and Gynecology

## 2021-02-09 ENCOUNTER — Ambulatory Visit
Admission: RE | Admit: 2021-02-09 | Discharge: 2021-02-09 | Disposition: A | Discharge status: Home / Self Care | Payer: Federal, State, Local not specified - PPO | Source: Ambulatory Visit | Attending: Internal Medicine | Admitting: Internal Medicine

## 2021-02-09 ENCOUNTER — Other Ambulatory Visit: Payer: Self-pay

## 2021-02-09 DIAGNOSIS — Z1231 Encounter for screening mammogram for malignant neoplasm of breast: Secondary | ICD-10-CM | POA: Diagnosis not present

## 2021-03-07 ENCOUNTER — Other Ambulatory Visit: Payer: Self-pay | Admitting: *Deleted

## 2021-03-07 NOTE — Telephone Encounter (Signed)
Medication refill request: Ambien  Last AEX:  09-08-20 jk Next AEX: not scheduled Last MMG (if hormonal medication request): n/a Refill authorized: Today, please advise.   Medication pended for #30, 5RF. Please refill if appropriate.

## 2021-03-08 MED ORDER — ZOLPIDEM TARTRATE 5 MG PO TABS: 5.0000 mg | ORAL_TABLET | Freq: Every evening | ORAL | 5 refills | Status: DC | PRN

## 2021-04-27 DIAGNOSIS — L820 Inflamed seborrheic keratosis: Secondary | ICD-10-CM | POA: Diagnosis not present

## 2021-09-03 ENCOUNTER — Other Ambulatory Visit: Payer: Self-pay | Admitting: Nurse Practitioner

## 2021-09-05 NOTE — Telephone Encounter (Signed)
Medication refill request: Ambien 5mg   Last AEX:  09/08/20 with JK  Next AEX: none scheduled at this time. Note sent to pharmacy for patient to schedule before additional refills will be filled  Last MMG (if hormonal medication request): 02/09/21 density C Bi-rads 1 neg  Refill authorized: #30 with 0 Rf pended

## 2021-10-04 ENCOUNTER — Other Ambulatory Visit: Payer: Self-pay | Admitting: Nurse Practitioner

## 2021-10-06 ENCOUNTER — Ambulatory Visit (INDEPENDENT_AMBULATORY_CARE_PROVIDER_SITE_OTHER): Payer: Federal, State, Local not specified - PPO | Admitting: Nurse Practitioner

## 2021-10-06 ENCOUNTER — Other Ambulatory Visit (HOSPITAL_COMMUNITY)
Admission: RE | Admit: 2021-10-06 | Discharge: 2021-10-06 | Disposition: A | Discharge status: Home / Self Care | Payer: Federal, State, Local not specified - PPO | Source: Ambulatory Visit | Attending: Nurse Practitioner | Admitting: Nurse Practitioner

## 2021-10-06 ENCOUNTER — Other Ambulatory Visit: Payer: Self-pay

## 2021-10-06 ENCOUNTER — Encounter: Payer: Self-pay | Admitting: Nurse Practitioner

## 2021-10-06 VITALS — BP 112/76 | HR 83 | Ht 61.5 in | Wt 152.0 lb

## 2021-10-06 DIAGNOSIS — G4700 Insomnia, unspecified: Secondary | ICD-10-CM

## 2021-10-06 DIAGNOSIS — Z780 Asymptomatic menopausal state: Secondary | ICD-10-CM | POA: Diagnosis not present

## 2021-10-06 DIAGNOSIS — Z01419 Encounter for gynecological examination (general) (routine) without abnormal findings: Secondary | ICD-10-CM

## 2021-10-06 MED ORDER — ZOLPIDEM TARTRATE 5 MG PO TABS: 5.0000 mg | ORAL_TABLET | Freq: Every evening | ORAL | 0 refills | Status: DC | PRN

## 2021-10-06 NOTE — Progress Notes (Signed)
[name] 04-04-1961 [id number]   History:  61 y.o. [id number] presents for annual exam. Postmenopausal. 2002 LGSIL, normal until 2014 - normal cytology positive HR HPV negative 16/18/45, normal since. Takes Ambien for insomnia. She began having trouble sleeping a couple of years ago when her son was deployed, also caring for mother.   Gynecologic History Patient's last menstrual period was 07/29/2018.   Contraception/Family planning: post menopausal status Sexually active: Yes  Health Maintenance Last Pap: 08/08/2018. Results were: Normal, 3-year repeat Last mammogram: 02/09/2021. Results were: Normal Last colonoscopy: 10/10/2013. Results were: benign polyp, 10-year recall Last Dexa: Not indicated   Past medical history, past surgical history, family history and social history were all reviewed and documented in the EPIC chart. Married. Retired 1 week ago, was an Forensic psychologist for 35 years. 32 yo daughter, lives in [place]. Son in [place] in [place].   ROS:  A ROS was performed and pertinent positives and negatives are included.  Exam:  Vitals:   10/06/21 1327  BP: 112/76  Pulse: 83  SpO2: 96%  Weight: 152 lb (68.9 kg)  Height: 5' 1.5" (1.562 m)   Body mass index is 28.26 kg/m.  General appearance:  Normal Thyroid:  Symmetrical, normal in size, without palpable masses or nodularity. Respiratory  Auscultation:  Clear without wheezing or rhonchi Cardiovascular  Auscultation:  Regular rate, without rubs, murmurs or gallops  Edema/varicosities:  Not grossly evident Abdominal  Soft,nontender, without masses, guarding or rebound.  Liver/spleen:  No organomegaly noted  Hernia:  None appreciated  Skin  Inspection:  Grossly normal Breasts: Examined lying and sitting.   Right: Without masses, retractions, nipple discharge or axillary adenopathy.   Left: Without masses, retractions, nipple discharge or axillary adenopathy. Genitourinary   Inguinal/mons:  Normal without  inguinal adenopathy  External genitalia:  Normal appearing vulva with no masses, tenderness, or lesions  BUS/Urethra/Skene's glands:  Normal  Vagina:  Normal appearing with normal color and discharge, no lesions  Cervix:  Normal appearing without discharge or lesions  Uterus:  Normal in size, shape and contour.  Midline and mobile, nontender  Adnexa/parametria:     Rt: Normal in size, without masses or tenderness.   Lt: Normal in size, without masses or tenderness.  Anus and perineum: Normal  Digital rectal exam: Declines  Patient informed chaperone available to be present for breast and pelvic exam. Patient has requested no chaperone to be present. Patient has been advised what will be completed during breast and pelvic exam.   Assessment/Plan:  61 y.o. [id number] for annual exam.   Well female exam with routine gynecological exam - Plan: Cytology - PAP( [facility]). Education provided on SBEs, importance of preventative screenings, current guidelines, high calcium diet, regular exercise, and multivitamin daily.  Labs with PCP.   Insomnia, unspecified type - Plan: zolpidem (AMBIEN) 5 MG tablet nightly as needed for sleep. #30 provided.   Postmenopausal - no HRT, no bleeding  Screening for cervical cancer - 2002 LGSIL, normal until 2014 - normal cytology positive HR HPV negative 16/18/45, normal since. Pap with reflex today.   Screening for breast cancer - Normal mammogram history.  Continue annual screenings.  Normal breast exam today.  Screening for colon cancer - 2015 colonoscopy. Will repeat at 10-year interval per GI's recommendation.   Screening for osteoporosis - Average risk. Will plan for DXA at age 76. She takes daily Vitamin D + Calcium supplement and exercises daily - lifting and cardio.  Return in 1 year for annual.  [place], 1:38 PM 10/06/2021

## 2021-10-10 LAB — CYTOLOGY - PAP: Diagnosis: NEGATIVE

## 2021-11-04 ENCOUNTER — Other Ambulatory Visit: Payer: Self-pay | Admitting: Nurse Practitioner

## 2021-11-04 DIAGNOSIS — G4700 Insomnia, unspecified: Secondary | ICD-10-CM

## 2021-11-04 NOTE — Telephone Encounter (Signed)
Annual exam was on 10/06/21

## 2021-12-03 ENCOUNTER — Other Ambulatory Visit: Payer: Self-pay | Admitting: Nurse Practitioner

## 2021-12-03 DIAGNOSIS — G4700 Insomnia, unspecified: Secondary | ICD-10-CM

## 2021-12-05 NOTE — Telephone Encounter (Signed)
Med refill request: Ambien '5mg'$  PO PRN for sleep ?Last AEX: 10/06/21, TW ?Next AEX: Not scheduled ?Last MMG (if hormonal med) N/A ? ?Last Rx sent 11/04/21, #30/0RF ? ?Refill authorized: Please Advise? ? ?

## 2022-01-03 ENCOUNTER — Other Ambulatory Visit: Payer: Self-pay | Admitting: Nurse Practitioner

## 2022-01-03 DIAGNOSIS — G4700 Insomnia, unspecified: Secondary | ICD-10-CM

## 2022-01-03 NOTE — Telephone Encounter (Signed)
Last AEX 10/06/21.  

## 2022-02-01 ENCOUNTER — Other Ambulatory Visit: Payer: Self-pay | Admitting: Nurse Practitioner

## 2022-02-01 DIAGNOSIS — H2513 Age-related nuclear cataract, bilateral: Secondary | ICD-10-CM | POA: Diagnosis not present

## 2022-02-01 DIAGNOSIS — G4700 Insomnia, unspecified: Secondary | ICD-10-CM

## 2022-02-01 DIAGNOSIS — H04123 Dry eye syndrome of bilateral lacrimal glands: Secondary | ICD-10-CM | POA: Diagnosis not present

## 2022-02-02 NOTE — Telephone Encounter (Signed)
Last AEX 10/06/21.  

## 2022-02-03 DIAGNOSIS — U071 COVID-19: Secondary | ICD-10-CM | POA: Diagnosis not present

## 2022-02-03 DIAGNOSIS — R519 Headache, unspecified: Secondary | ICD-10-CM | POA: Diagnosis not present

## 2022-02-24 ENCOUNTER — Other Ambulatory Visit: Payer: Self-pay | Admitting: Internal Medicine

## 2022-02-24 DIAGNOSIS — Z1231 Encounter for screening mammogram for malignant neoplasm of breast: Secondary | ICD-10-CM

## 2022-03-03 ENCOUNTER — Ambulatory Visit
Admission: RE | Admit: 2022-03-03 | Discharge: 2022-03-03 | Disposition: A | Discharge status: Home / Self Care | Payer: Federal, State, Local not specified - PPO | Source: Ambulatory Visit | Attending: Internal Medicine | Admitting: Internal Medicine

## 2022-03-03 DIAGNOSIS — Z1231 Encounter for screening mammogram for malignant neoplasm of breast: Secondary | ICD-10-CM

## 2022-04-01 ENCOUNTER — Other Ambulatory Visit: Payer: Self-pay | Admitting: Nurse Practitioner

## 2022-04-01 DIAGNOSIS — G4700 Insomnia, unspecified: Secondary | ICD-10-CM

## 2022-04-05 NOTE — Telephone Encounter (Signed)
Last AEX 10/06/21.

## 2022-05-29 ENCOUNTER — Telehealth: Payer: Self-pay | Admitting: *Deleted

## 2022-05-29 ENCOUNTER — Encounter: Payer: Self-pay | Admitting: Nurse Practitioner

## 2022-05-29 ENCOUNTER — Ambulatory Visit: Payer: Federal, State, Local not specified - PPO | Admitting: Nurse Practitioner

## 2022-05-29 VITALS — BP 112/72 | HR 88

## 2022-05-29 DIAGNOSIS — N3281 Overactive bladder: Secondary | ICD-10-CM

## 2022-05-29 DIAGNOSIS — N393 Stress incontinence (female) (male): Secondary | ICD-10-CM | POA: Diagnosis not present

## 2022-05-29 MED ORDER — MIRABEGRON ER 25 MG PO TB24: 25.0000 mg | ORAL_TABLET | Freq: Every day | ORAL | 1 refills | Status: DC

## 2022-05-29 NOTE — Progress Notes (Signed)
   Acute Office Visit  Subjective:    Patient ID: [name], female    DOB: 11/24/1960, 61 y.o.   MRN: [id number]   HPI 61 y.o. presents today for urinary frequency, urgency and stress incontinence. This is not new for her. Saw Urogyn last year who prescribed Trospium Chloride 60 mg ER. Patient had requested this because someone she knew is taking it. She did fine on this and would like to restart or try something different. Tried Vesicare prior with no improvement in symptoms. She drinks >200 cc per day of water. Voids every 30 minutes first half of the day and then it improves throughout the day. Good amount of urine with voiding.   Review of Systems  Constitutional: Negative.   Genitourinary:  Positive for frequency and urgency. Negative for difficulty urinating, dysuria, flank pain and hematuria.       Stress incontinence       Objective:    Physical Exam Constitutional:      Appearance: Normal appearance.   GU: not indicated  BP 112/72   Pulse 88   LMP 07/29/2018   SpO2 97%  Wt Readings from Last 3 Encounters:  10/06/21 152 lb (68.9 kg)  12/13/20 150 lb (68 kg)  12/07/20 151 lb (68.5 kg)        Patient informed chaperone available to be present for breast and/or pelvic exam. Patient has requested no chaperone to be present. Patient has been advised what will be completed during breast and pelvic exam.   Assessment & Plan:   Problem List Items Addressed This Visit   None Visit Diagnoses     Overactive bladder    -  Primary   Relevant Medications   mirabegron ER (MYRBETRIQ) 25 MG TB24 tablet   Stress incontinence       Relevant Medications   mirabegron ER (MYRBETRIQ) 25 MG TB24 tablet      Plan: Discussed OAB and management options. Will try Myrbetriq ER25 mg daily. We can increase to 50 mg after 4-8 weeks if appropriate. Recommend consuming less water (cc total = body weight, so around 150 cc daily), avoid fluids 2 hours before bed. Discussed stress  incontinence and management with lifestyle changes - avoiding alcohol, caffeine, spicy foods, weight loss, and pelvic floor PT. She is agreeable to PT. Will send referral.      Tamela Gammon DNP, 2:05 PM 05/29/2022

## 2022-05-29 NOTE — Telephone Encounter (Signed)
-----   Message from Tamela Gammon, NP sent at 05/29/2022  2:06 PM EDT ----- Regarding: Pelvic floor PT Please send referral to pelvic floor PT for OAB and stress incontinence. Thanks.

## 2022-05-29 NOTE — Telephone Encounter (Signed)
Referral placed at PT for pelvic floor.

## 2022-05-30 NOTE — Telephone Encounter (Signed)
PT called x 1.  

## 2022-06-04 ENCOUNTER — Other Ambulatory Visit: Payer: Self-pay | Admitting: Nurse Practitioner

## 2022-06-04 DIAGNOSIS — G4700 Insomnia, unspecified: Secondary | ICD-10-CM

## 2022-06-06 NOTE — Telephone Encounter (Signed)
Last annual exam was 10/06/21

## 2022-06-07 NOTE — Telephone Encounter (Signed)
Patient scheduled on 06/27/22

## 2022-06-27 ENCOUNTER — Other Ambulatory Visit: Payer: Self-pay

## 2022-06-27 ENCOUNTER — Encounter: Payer: Self-pay | Admitting: Physical Therapy

## 2022-06-27 ENCOUNTER — Ambulatory Visit: Payer: Federal, State, Local not specified - PPO | Attending: Nurse Practitioner | Admitting: Physical Therapy

## 2022-06-27 DIAGNOSIS — N3942 Incontinence without sensory awareness: Secondary | ICD-10-CM | POA: Insufficient documentation

## 2022-06-27 DIAGNOSIS — M6281 Muscle weakness (generalized): Secondary | ICD-10-CM | POA: Insufficient documentation

## 2022-06-27 DIAGNOSIS — N3281 Overactive bladder: Secondary | ICD-10-CM | POA: Insufficient documentation

## 2022-06-27 DIAGNOSIS — N393 Stress incontinence (female) (male): Secondary | ICD-10-CM | POA: Insufficient documentation

## 2022-06-27 NOTE — Therapy (Addendum)
OUTPATIENT PHYSICAL THERAPY FEMALE PELVIC EVALUATION   Patient Name: [name] MRN: [id number] DOB:01-25-1961, 61 y.o., female Today's Date: 06/27/2022   PT End of Session - 06/27/22 1332     Visit Number 1    Number of Visits 6    Date for PT Re-Evaluation 09/19/22    Authorization Type BCBS    PT Start Time 0847    PT Stop Time 0932    PT Time Calculation (min) 45 min    Activity Tolerance Patient tolerated treatment well    Behavior During Therapy [facility] for tasks assessed/performed             Past Medical History:  Diagnosis Date   Arthritis    Cancer ([place])    skin cancer chest   Cervical high risk HPV (human papillomavirus) test positive 11/2012   Normal cytology recommend repeat Pap with HPV one year   LGSIL (low grade squamous intraepithelial dysplasia)    07/2001   Urinary incontinence    USI   Past Surgical History:  Procedure Laterality Date   COLPOSCOPY     HAND SURGERY  2012   RIGHT HAND MIDDLE FINGER   LASIK     [place]   TONSILLECTOMY AND ADENOIDECTOMY     Patient Active Problem List   Diagnosis Date Noted   Vitamin D deficiency 12/03/2020   Dysuria 12/03/2020   Urinary frequency 12/03/2020   COVID-19 virus infection 10/08/2019   Hypertriglyceridemia 10/08/2019   Encounter for well adult exam with abnormal findings 10/08/2019   HLD (hyperlipidemia) 10/08/2019   Blood pressure elevated without history of HTN 10/08/2019    PCP: Biagio Borg, MD   REFERRING PROVIDER: Tamela Gammon , NP  REFERRING DIAG: Overactive Bladder    THERAPY DIAG:  Urinary incontinence without sensory awareness  Muscle weakness (generalized)  Rationale for Evaluation and Treatment Rehabilitation  ONSET DATE: 06/27/2022   SUBJECTIVE:                                                                                                                                                                                           SUBJECTIVE  STATEMENT: General incontinence. The incontience has been occurring for years. Nothing she can recalled triggered it. Took medication to control it.  Fluid intake: Yes: 5x 16 oz   cup hot tea in morning and at lunch    PAIN:  Are you having pain? No   PRECAUTIONS: None  WEIGHT BEARING RESTRICTIONS No  FALLS:  Has patient fallen in last 6 months? No  LIVING ENVIRONMENT: Lives with: lives with their family and lives with their  spouse Lives in: House/apartment Stairs: Yes: Internal: 2 flights steps;   Has following equipment at home: None  OCCUPATION: Retired   PLOF: Independent physically active everyday   PATIENT GOALS Not to have to focus on where to use the bathroom.   PERTINENT HISTORY:   Sexual abuse: No  BOWEL MOVEMENT Pain with bowel movement: No Type of bowel movement:Type (Bristol Stool Scale) 2/3 Fully empty rectum: Yes:   Leakage: No Pads: No Fiber supplement:   URINATION Pain with urination: No history of bladder infection  Fully empty bladder: Yes: takes longer  Stream:  Medium stream  Urgency: Yes: in the morning  Frequency: morning every 30 min and not as frequent in the evenings  Leakage: Sneezing, hiking,lifting, and jumping  Pads: Yes: 3/4 a day   INTERCOURSE Pain with intercourse:  no  Ability to have vaginal penetration:  Yes:   Climax: yes   PREGNANCY Vaginal deliveries 2 Tearing Yes: w/ stitches  Currently pregnant No  PROLAPSE None    OBJECTIVE:   PATIENT SURVEYS:   PFIQ-7 - Will be assessed next session  COGNITION:  Overall cognitive status: Within functional limits for tasks assessed     MUSCLE LENGTH: Hamstrings: Right 80 deg; Left 80 deg  LUMBAR SPECIAL TESTS:  Straight leg raise test: Negative  FUNCTIONAL TESTS:  Single leg stand- right SIJ unlocked   GAIT:  Comments: WFL               POSTURE: anterior pelvic tilt and trunk shifted to right    PELVIC ALIGNMENT: Anterior rotation of left in standing;  left ilium upslip in supine   LUMBARAROM/PROM  A/PROM A/PROM  eval  Flexion 75%  Extension   Right lateral flexion   Left lateral flexion   Right rotation   Left rotation    (Blank rows = not tested)  LOWER EXTREMITY ROM:  Passive ROM Right eval Left eval  Hip flexion    Hip extension    Hip abduction    Hip adduction    Hip internal rotation    Hip external rotation  75%  Knee flexion    Knee extension    Ankle dorsiflexion    Ankle plantarflexion    Ankle inversion    Ankle eversion     (Blank rows = not tested)  LOWER EXTREMITY MMT:  MMT Right eval Left eval  Hip flexion 5/5   Hip extension    Hip abduction 4+/5   Hip adduction 5/5   Hip internal rotation    Hip external rotation    Knee flexion    Knee extension    Ankle dorsiflexion    Ankle plantarflexion    Ankle inversion    Ankle eversion      PALPATION:   General: Lumbar paraspinals tight                External Perineal Exam normal but not a lot of movement with contraction and bulging                              Internal Pelvic Floor 2/5 mmt, 10 quick flicks, 2 second hold, slow relaxation, with contraction posterior pelvic til with glute and abdominal contraction          Patient confirms identification and approves PT to assess internal pelvic floor and treatment Yes No emotional/communication barriers or cognitive limitation. Patient is motivated to learn. Patient understands and agrees with  treatment goals and plan. PT explains patient will be examined in standing, sitting, and lying down to see how their muscles and joints work. When they are ready, they will be asked to remove their underwear so PT can examine their perineum. The patient is also given the option of providing their own chaperone as one is not provided in our facility. The patient also has the right and is explained the right to defer or refuse any part of the evaluation or treatment including the internal exam. With the  patient's consent, PT will use one gloved finger to gently assess the muscles of the pelvic floor, seeing how well it contracts and relaxes and if there is muscle symmetry. After, the patient will get dressed and PT and patient will discuss exam findings and plan of care. PT and patient discuss plan of care, schedule, attendance policy and HEP activities.   PELVIC MMT:   MMT eval  Vaginal 2/5   Internal Anal Sphincter   External Anal Sphincter   Puborectalis   Diastasis Recti 1 finger from umbilicus and xiphoid process   (Blank rows = not tested)        TONE: normal  PROLAPSE: n/a  TODAY'S TREATMENT  EVAL and initial HEP as shown below    PATIENT EDUCATION:  Education details: Pelvic floor therapy Person educated: Patient Education method: Explanation Education comprehension: verbalized understanding   HOME EXERCISE PROGRAM: Access Code: [email or web address] URL: [email or web address] Date: 06/27/2022 Prepared by: Jari Favre Exercises - kegel with hip internal rotation - 1 x daily - 7 x weekly - 1 sets - 10 reps - Prone Pelvic Floor Contraction on Swiss Ball - 1 x daily - 7 x weekly - 1 sets - 10 repsAccess Code: [email or web address] URL: [email or web address] Date: 06/27/2022 Prepared by: Jari Favre Exercises - kegel with hip internal rotation - 1 x daily - 7 x weekly - 1 sets - 10 reps - Prone Pelvic Floor Contraction on Swiss Ball - 1 x daily - 7 x weekly - 1 sets - 10 reps  ASSESSMENT:  CLINICAL IMPRESSION: Patient is a 61 y.o. women who was seen today for physical therapy evaluation and treatment for urinary incontinence . Pt demonstrated a lack of movement within the pelvic floor as well as a lack of muscle endurance. Pt had a 2/5 with 10 quick flicks and 2 second hold. Pt was limited in hip external rotation. Postured showed right lateral shift and tight lumbar paraspinals.Pt will benefit from skilled PT to address above mentioned impairments and  return to full functional and recreational activities without incontinence.   OBJECTIVE IMPAIRMENTS decreased coordination, decreased endurance, decreased knowledge of condition, decreased ROM, decreased strength, impaired flexibility, and impaired tone.   ACTIVITY LIMITATIONS carrying, lifting, bending, squatting, stairs, and continence  PARTICIPATION LIMITATIONS: community activity and yard work  [place] Age and Time since onset of injury/illness/exacerbation are also affecting patient's functional outcome.   REHAB POTENTIAL: Good  CLINICAL DECISION MAKING: Evolving/moderate complexity  EVALUATION COMPLEXITY: Moderate   GOALS: Goals reviewed with patient? Yes  SHORT TERM GOALS: Target date: follow up   Patient will be compliant with basic HEP program to ensure understanding of exercise  Baseline: Goal status: INITIAL   LONG TERM GOALS: Target date: 09/19/2022   Patient will be able maintain a pelvic floor contraction for 15 seconds to ensure functional pelvic floor endurance to help with urinary incontinence  Baseline: 2 seconds Goal status: INITIAL  2.  Patient will report the use of 1-2 pads  throughout day to demonstrate greater muscle coordination of pelvic floor Baseline: 3-4 a day  Goal status: INITIAL   3.  Patient will be independent with advance HEP to maintain improvements made throughout therapy  Baseline:  Goal status: INITIAL  4.  Patient will be able to do community activities without having the fear to find the restroom.  Baseline:  Goal status: INITIAL  5.  Patient will be able to squat 20# without having leakage to demonstrate proper pelvic floor engagement  Baseline:  Goal status: INITIAL    PLAN: PT FREQUENCY: 1x/week  PT DURATION: 12 weeks  PLANNED INTERVENTIONS: Therapeutic exercises, Therapeutic activity, Neuromuscular re-education, Balance training, Gait training, Patient/Family education, Self Care, Joint mobilization, Stair  training, Dry Needling, Cryotherapy, Moist heat, Taping, Biofeedback, and Manual therapy  PLAN FOR NEXT SESSION: Diaphragmatic breathing , Pelvic floor strengthening and endurance training. stretching, and hip strengthening    Shaana Acocella, Student-PT 06/27/2022, 1:33 PM   I agree with the following treatment note after reviewing documentation. This session was performed under the supervision of a licensed clinician.  Gustavus Bryant, PT 06/27/22 10:02 PM

## 2022-07-20 ENCOUNTER — Ambulatory Visit: Payer: Federal, State, Local not specified - PPO | Admitting: Physical Therapy

## 2022-07-24 ENCOUNTER — Encounter: Payer: Self-pay | Admitting: *Deleted

## 2022-07-24 DIAGNOSIS — J0100 Acute maxillary sinusitis, unspecified: Secondary | ICD-10-CM | POA: Diagnosis not present

## 2022-07-25 ENCOUNTER — Ambulatory Visit: Payer: Federal, State, Local not specified - PPO | Attending: Nurse Practitioner | Admitting: Physical Therapy

## 2022-07-25 ENCOUNTER — Encounter: Payer: Self-pay | Admitting: Physical Therapy

## 2022-07-25 DIAGNOSIS — M6281 Muscle weakness (generalized): Secondary | ICD-10-CM | POA: Diagnosis not present

## 2022-07-25 DIAGNOSIS — N3942 Incontinence without sensory awareness: Secondary | ICD-10-CM | POA: Insufficient documentation

## 2022-07-25 NOTE — Therapy (Signed)
OUTPATIENT PHYSICAL THERAPY FEMALE PELVIC TREATMENT   Patient Name: [name] MRN: [id number] DOB:[birth year], 61 y.o., female Today's Date: 07/25/2022   PT End of Session - 07/25/22 1400     Visit Number 2    Number of Visits 6    Date for PT Re-Evaluation 09/19/22    Authorization Type BCBS    PT Start Time 1401    PT Stop Time 4854    PT Time Calculation (min) 42 min    Activity Tolerance Patient tolerated treatment well    Behavior During Therapy [facility] for tasks assessed/performed              Past Medical History:  Diagnosis Date   Arthritis    Cancer ([place])    skin cancer chest   Cervical high risk HPV (human papillomavirus) test positive 11/2012   Normal cytology recommend repeat Pap with HPV one year   LGSIL (low grade squamous intraepithelial dysplasia)    07/2001   Urinary incontinence    USI   Past Surgical History:  Procedure Laterality Date   COLPOSCOPY     HAND SURGERY  2012   RIGHT HAND MIDDLE FINGER   LASIK     [place]   TONSILLECTOMY AND ADENOIDECTOMY     Patient Active Problem List   Diagnosis Date Noted   Vitamin D deficiency 12/03/2020   Dysuria 12/03/2020   Urinary frequency 12/03/2020   COVID-19 virus infection 10/08/2019   Hypertriglyceridemia 10/08/2019   Encounter for well adult exam with abnormal findings 10/08/2019   HLD (hyperlipidemia) 10/08/2019   Blood pressure elevated without history of HTN 10/08/2019    PCP: Biagio Borg, MD   REFERRING PROVIDER: Tamela Gammon , NP  REFERRING DIAG: Overactive Bladder    THERAPY DIAG:  Urinary incontinence without sensory awareness  Muscle weakness (generalized)  Rationale for Evaluation and Treatment Rehabilitation  ONSET DATE: 07/25/2022   SUBJECTIVE:                                                                                                                                                                                           SUBJECTIVE  STATEMENT: She has been leaking during her exercise.  She has notice little difference but had a cold last week and every time she sneezed she would have leakage. She has been trying wall pilates and daily burn fitness classes everyday. Any jumping activities she has the leakage.   Fluid intake: Yes: 5x 16 oz   cup hot tea in morning and at lunch    PAIN:  Are you having pain? No   PRECAUTIONS: None  WEIGHT BEARING RESTRICTIONS No  FALLS:  Has patient fallen in last 6 months? No  LIVING ENVIRONMENT: Lives with: lives with their family and lives with their spouse Lives in: House/apartment Stairs: Yes: Internal: 2 flights steps;   Has following equipment at home: None  OCCUPATION: Retired   PLOF: Independent physically active everyday   PATIENT GOALS Not to have to focus on where to use the bathroom.   PERTINENT HISTORY:   Sexual abuse: No  BOWEL MOVEMENT Pain with bowel movement: No Type of bowel movement:Type (Bristol Stool Scale) 2/3 Fully empty rectum: Yes:   Leakage: No Pads: No Fiber supplement:   URINATION Pain with urination: No history of bladder infection  Fully empty bladder: Yes: takes longer  Stream:  Medium stream  Urgency: Yes: in the morning  Frequency: morning every 30 min and not as frequent in the evenings  Leakage: Sneezing, hiking,lifting, and jumping  Pads: Yes: 3/4 a day   INTERCOURSE Pain with intercourse:  no  Ability to have vaginal penetration:  Yes:   Climax: yes   PREGNANCY Vaginal deliveries 2 Tearing Yes: w/ stitches  Currently pregnant No  PROLAPSE None    OBJECTIVE:   PATIENT SURVEYS:   PFIQ-7 - Will be assessed next session  COGNITION:  Overall cognitive status: Within functional limits for tasks assessed     MUSCLE LENGTH: Hamstrings: Right 80 deg; Left 80 deg  LUMBAR SPECIAL TESTS:  Straight leg raise test: Negative  FUNCTIONAL TESTS:  Single leg stand- right SIJ unlocked   GAIT:  Comments:  WFL               POSTURE: anterior pelvic tilt and trunk shifted to right    PELVIC ALIGNMENT: Anterior rotation of left in standing; left ilium upslip in supine   LUMBARAROM/PROM  A/PROM A/PROM  eval  Flexion 75%  Extension   Right lateral flexion   Left lateral flexion   Right rotation   Left rotation    (Blank rows = not tested)  LOWER EXTREMITY ROM:  Passive ROM Right eval Left eval  Hip flexion    Hip extension    Hip abduction    Hip adduction    Hip internal rotation    Hip external rotation  75%  Knee flexion    Knee extension    Ankle dorsiflexion    Ankle plantarflexion    Ankle inversion    Ankle eversion     (Blank rows = not tested)  LOWER EXTREMITY MMT:  MMT Right eval Left eval  Hip flexion 5/5   Hip extension    Hip abduction 4+/5   Hip adduction 5/5   Hip internal rotation    Hip external rotation    Knee flexion    Knee extension    Ankle dorsiflexion    Ankle plantarflexion    Ankle inversion    Ankle eversion      PALPATION:   General: Lumbar paraspinals tight                External Perineal Exam normal but not a lot of movement with contraction and bulging                              Internal Pelvic Floor 2/5 mmt, 10 quick flicks, 2 second hold, slow relaxation, with contraction posterior pelvic til with glute and abdominal contraction          Patient confirms  identification and approves PT to assess internal pelvic floor and treatment Yes No emotional/communication barriers or cognitive limitation. Patient is motivated to learn. Patient understands and agrees with treatment goals and plan. PT explains patient will be examined in standing, sitting, and lying down to see how their muscles and joints work. When they are ready, they will be asked to remove their underwear so PT can examine their perineum. The patient is also given the option of providing their own chaperone as one is not provided in our facility. The patient also  has the right and is explained the right to defer or refuse any part of the evaluation or treatment including the internal exam. With the patient's consent, PT will use one gloved finger to gently assess the muscles of the pelvic floor, seeing how well it contracts and relaxes and if there is muscle symmetry. After, the patient will get dressed and PT and patient will discuss exam findings and plan of care. PT and patient discuss plan of care, schedule, attendance policy and HEP activities.   PELVIC MMT:   MMT eval  Vaginal 2/5   Internal Anal Sphincter   External Anal Sphincter   Puborectalis   Diastasis Recti 1 finger from umbilicus and xiphoid process   (Blank rows = not tested)        TONE: normal  PROLAPSE: n/a  TODAY'S TREATMENT  08/01/22 Exercise: Dead bug hold 2x 30sec Hooklying with arms reaching overhead 1x12 90/90 toe taps 1x15 bil Hip circles  Cat and cow Child pose QPED Alternating UE 1x10 bil Alternating LE 1x10 bil Alternating UE/LE 1x10 bil Seated Theraball marches arm up 1x15 bil Ther[place]all marches with rotation  1x10 bil Ther[place]all with kick out 1x10 bil Standing  Wall squat with arm reaches 1x10    PATIENT EDUCATION:  Education details:HEP Person educated: Patient Education method: Explanation, Media planner, and Handouts Education comprehension: verbalized understanding   HOME EXERCISE PROGRAM: Access Code: [email or web address] URL: [email or web address] Date: 01-Aug-2022 Prepared by: Jari Favre Exercises - kegel with hip internal rotation - 1 x daily - 7 x weekly - 1 sets - 10 reps - Prone Pelvic Floor Contraction on Swiss Ball - 1 x daily - 7 x weekly - 1 sets - 10 reps - Quadruped Circle Weight Shifts - 1 x daily - 7 x weekly - 3 sets - 10 reps - Swiss Ball March - 1 x daily - 7 x weekly - 3 sets - 10 reps - Quadruped Cat Cow - 1 x daily - 7 x weekly - 3 sets - 10 reps  ASSESSMENT:  CLINICAL IMPRESSION: Pt responded well to  therapy today. Today's session focused on pelvic floor coordination and TA activation in different positions. Patient showed improvements in coordinating pelvic floor muscles with breathing and she required minimal verbal cueing on when to relax and contract pelvic floor muscle. Overall pt had good functional core strength, pt demonstrated minor fatigue and endurance deficits as the core strengthening exercises progressed. Patient was given an HEP to work on pelvic floor lengthening and and advance core exercise. Patient would benefit from continue skilled therapy to continue to work on pelvic floor coordination and strength for a better QOL.   OBJECTIVE IMPAIRMENTS decreased coordination, decreased endurance, decreased knowledge of condition, decreased ROM, decreased strength, impaired flexibility, and impaired tone.   ACTIVITY LIMITATIONS carrying, lifting, bending, squatting, stairs, and continence  PARTICIPATION LIMITATIONS: community activity and yard work  [place] Age and Time since onset of injury/illness/exacerbation are also affecting  patient's functional outcome.   REHAB POTENTIAL: Good  CLINICAL DECISION MAKING: Evolving/moderate complexity  EVALUATION COMPLEXITY: Moderate   GOALS: Goals reviewed with patient? Yes  SHORT TERM GOALS: Target date: follow up  Updated: 07/25/2022  Patient will be compliant with basic HEP program to ensure understanding of exercise  Baseline: Working on pelvic floor coordination becoming more natural when performing exercises Goal status: IN PROGRESS   LONG TERM GOALS: Target date: 09/19/2022 Updated: 07/25/2022   Patient will be able maintain a pelvic floor contraction for 15 seconds to ensure functional pelvic floor endurance to help with urinary incontinence  Baseline: 2 seconds Goal status: IN PROGRESS  2.  Patient will report the use of 1-2 pads throughout day to demonstrate greater muscle coordination of pelvic  floor Baseline: 3-4 a day  Goal status: IN PROGRESS   3.  Patient will be independent with advance HEP to maintain improvements made throughout therapy  Baseline:  Goal status: INITIAL  4.  Patient will be able to do community activities without having the fear to find the restroom.  Baseline:  Goal status: INITIAL  5.  Patient will be able to squat 20# without having leakage to demonstrate proper pelvic floor engagement  Baseline:  Goal status: INITIAL    PLAN: PT FREQUENCY: 1x/week  PT DURATION: 12 weeks  PLANNED INTERVENTIONS: Therapeutic exercises, Therapeutic activity, Neuromuscular re-education, Balance training, Gait training, Patient/Family education, Self Care, Joint mobilization, Stair training, Dry Needling, Cryotherapy, Moist heat, Taping, Biofeedback, and Manual therapy  PLAN FOR NEXT SESSION: Pelvic floor isolation with activities, Hip and glute strengthening, TA activation functional activities    Bama Hanselman, Student-PT 07/25/2022  3:03 PM   I agree with the following treatment note after reviewing documentation. This session was performed under the supervision of a licensed clinician.  Gustavus Bryant, PT 07/25/22 3:01 PM

## 2022-08-01 ENCOUNTER — Encounter: Payer: Self-pay | Admitting: Physical Therapy

## 2022-08-01 ENCOUNTER — Ambulatory Visit: Payer: Federal, State, Local not specified - PPO | Admitting: Physical Therapy

## 2022-08-01 DIAGNOSIS — M6281 Muscle weakness (generalized): Secondary | ICD-10-CM

## 2022-08-01 DIAGNOSIS — N3942 Incontinence without sensory awareness: Secondary | ICD-10-CM | POA: Diagnosis not present

## 2022-08-01 NOTE — Therapy (Addendum)
OUTPATIENT PHYSICAL THERAPY FEMALE PELVIC TREATMENT   Patient Name: [name] MRN: [id number] DOB:1960-10-20, 61 y.o., female Today's Date: 08/01/2022   PT End of Session - 08/01/22 1404     Visit Number 3    Number of Visits 6    Date for PT Re-Evaluation 09/19/22    Authorization Type BCBS    PT Start Time 0263    PT Stop Time 7858    PT Time Calculation (min) 43 min    Activity Tolerance Patient tolerated treatment well    Behavior During Therapy [facility] for tasks assessed/performed               Past Medical History:  Diagnosis Date   Arthritis    Cancer ([place])    skin cancer chest   Cervical high risk HPV (human papillomavirus) test positive 11/2012   Normal cytology recommend repeat Pap with HPV one year   LGSIL (low grade squamous intraepithelial dysplasia)    07/2001   Urinary incontinence    USI   Past Surgical History:  Procedure Laterality Date   COLPOSCOPY     HAND SURGERY  2012   RIGHT HAND MIDDLE FINGER   LASIK     [place]   TONSILLECTOMY AND ADENOIDECTOMY     Patient Active Problem List   Diagnosis Date Noted   Vitamin D deficiency 12/03/2020   Dysuria 12/03/2020   Urinary frequency 12/03/2020   COVID-19 virus infection 10/08/2019   Hypertriglyceridemia 10/08/2019   Encounter for well adult exam with abnormal findings 10/08/2019   HLD (hyperlipidemia) 10/08/2019   Blood pressure elevated without history of HTN 10/08/2019    PCP: Biagio Borg, MD   REFERRING PROVIDER: Tamela Gammon , NP  REFERRING DIAG: Overactive Bladder    THERAPY DIAG:  Urinary incontinence without sensory awareness  Muscle weakness (generalized)  Rationale for Evaluation and Treatment Rehabilitation  ONSET DATE: 08/01/2022   SUBJECTIVE:                                                                                                                                                                                           SUBJECTIVE  STATEMENT: She was able to do jumping jacks without leakage. Still leakage with sneezing and coughing. She doesn't have to get up at night as much.    Fluid intake: Yes: 5x 16 oz   cup hot tea in morning and at lunch    PAIN:  Are you having pain? No   PRECAUTIONS: None  WEIGHT BEARING RESTRICTIONS No  FALLS:  Has patient fallen in last 6 months? No  LIVING ENVIRONMENT: Lives  with: lives with their family and lives with their spouse Lives in: House/apartment Stairs: Yes: Internal: 2 flights steps;   Has following equipment at home: None  OCCUPATION: Retired   PLOF: Independent physically active everyday   PATIENT GOALS Not to have to focus on where to use the bathroom.   PERTINENT HISTORY:   Sexual abuse: No  BOWEL MOVEMENT Pain with bowel movement: No Type of bowel movement:Type (Bristol Stool Scale) 2/3 Fully empty rectum: Yes:   Leakage: No Pads: No Fiber supplement:   URINATION Pain with urination: No history of bladder infection  Fully empty bladder: Yes: takes longer  Stream:  Medium stream  Urgency: Yes: in the morning  Frequency: morning every 30 min and not as frequent in the evenings  Leakage: Sneezing, hiking,lifting, and jumping  Pads: Yes: 3/4 a day   INTERCOURSE Pain with intercourse:  no  Ability to have vaginal penetration:  Yes:   Climax: yes   PREGNANCY Vaginal deliveries 2 Tearing Yes: w/ stitches  Currently pregnant No  PROLAPSE None    OBJECTIVE:   PATIENT SURVEYS:   PFIQ-7 - Will be assessed next session  COGNITION:  Overall cognitive status: Within functional limits for tasks assessed     MUSCLE LENGTH: Hamstrings: Right 80 deg; Left 80 deg  LUMBAR SPECIAL TESTS:  Straight leg raise test: Negative  FUNCTIONAL TESTS:  Single leg stand- right SIJ unlocked   GAIT:  Comments: WFL               POSTURE: anterior pelvic tilt and trunk shifted to right    PELVIC ALIGNMENT: Anterior rotation of left in  standing; left ilium upslip in supine   LUMBARAROM/PROM  A/PROM A/PROM  eval  Flexion 75%  Extension   Right lateral flexion   Left lateral flexion   Right rotation   Left rotation    (Blank rows = not tested)  LOWER EXTREMITY ROM:  Passive ROM Right eval Left eval  Hip flexion    Hip extension    Hip abduction    Hip adduction    Hip internal rotation    Hip external rotation  75%  Knee flexion    Knee extension    Ankle dorsiflexion    Ankle plantarflexion    Ankle inversion    Ankle eversion     (Blank rows = not tested)  LOWER EXTREMITY MMT:  MMT Right eval Left eval  Hip flexion 5/5   Hip extension    Hip abduction 4+/5   Hip adduction 5/5   Hip internal rotation    Hip external rotation    Knee flexion    Knee extension    Ankle dorsiflexion    Ankle plantarflexion    Ankle inversion    Ankle eversion      PALPATION:   General: Lumbar paraspinals tight                External Perineal Exam normal but not a lot of movement with contraction and bulging                              Internal Pelvic Floor 2/5 mmt, 10 quick flicks, 2 second hold, slow relaxation, with contraction posterior pelvic til with glute and abdominal contraction          Patient confirms identification and approves PT to assess internal pelvic floor and treatment Yes No emotional/communication barriers or cognitive limitation. Patient  is motivated to learn. Patient understands and agrees with treatment goals and plan. PT explains patient will be examined in standing, sitting, and lying down to see how their muscles and joints work. When they are ready, they will be asked to remove their underwear so PT can examine their perineum. The patient is also given the option of providing their own chaperone as one is not provided in our facility. The patient also has the right and is explained the right to defer or refuse any part of the evaluation or treatment including the internal exam.  With the patient's consent, PT will use one gloved finger to gently assess the muscles of the pelvic floor, seeing how well it contracts and relaxes and if there is muscle symmetry. After, the patient will get dressed and PT and patient will discuss exam findings and plan of care. PT and patient discuss plan of care, schedule, attendance policy and HEP activities.   PELVIC MMT:   MMT eval  Vaginal 2/5   Internal Anal Sphincter   External Anal Sphincter   Puborectalis   Diastasis Recti 1 finger from umbilicus and xiphoid process   (Blank rows = not tested)        TONE: normal  PROLAPSE: n/a  TODAY'S TREATMENT  Aug 06, 2022 Exercise: Dead bug hold 2x 30sec Hooklying with arms reaching overhead 1x12 90/90 toe taps 1x15 bil Deadbug 20x10  Foam roller marches Foam roller dead bug  QPED Alternating UE 1x10 bil Alternating LE 1x10 bil Alternating UE/LE 1x10 bil Seated Sit to stands 16# with Diaphragmatic breathing   Diaphragmatic breathing   Standing  Wall squat with arm reaches 1x10  Wall squat with arm reaching with 8 pound    PATIENT EDUCATION:  Education details:HEP Person educated: Patient Education method: Explanation, Media planner, and Handouts Education comprehension: verbalized understanding   HOME EXERCISE PROGRAM: Access Code: [email or web address] URL: [email or web address] Date: 07/25/2022 Prepared by: Jari Favre Exercises - kegel with hip internal rotation - 1 x daily - 7 x weekly - 1 sets - 10 reps - Prone Pelvic Floor Contraction on Swiss Ball - 1 x daily - 7 x weekly - 1 sets - 10 reps - Quadruped Circle Weight Shifts - 1 x daily - 7 x weekly - 3 sets - 10 reps - Swiss Ball March - 1 x daily - 7 x weekly - 3 sets - 10 reps - Quadruped Cat Cow - 1 x daily - 7 x weekly - 3 sets - 10 reps  ASSESSMENT:  CLINICAL IMPRESSION: Pt responded well to therapy today. Today's session focused on  progressing pelvic floor coordination and TA activation in  different positions.Patient had difficulty with Pelvic floor coordination and diaphragmatic breathing while seated. She required moderate tactile and verbal cueing. Patient is able to continue to progress to harder TA activation activities. Patient was given an HEP to work on coordination of pelvic floor and breathing. Patient would benefit from continue skilled therapy to continue to work on pelvic floor coordination and strength for a better QOL.   OBJECTIVE IMPAIRMENTS decreased coordination, decreased endurance, decreased knowledge of condition, decreased ROM, decreased strength, impaired flexibility, and impaired tone.   ACTIVITY LIMITATIONS carrying, lifting, bending, squatting, stairs, and continence  PARTICIPATION LIMITATIONS: community activity and yard work  [place] Age and Time since onset of injury/illness/exacerbation are also affecting patient's functional outcome.   REHAB POTENTIAL: Good  CLINICAL DECISION MAKING: Evolving/moderate complexity  EVALUATION COMPLEXITY: Moderate   GOALS: Goals reviewed with patient? Yes  SHORT  TERM GOALS: Target date: follow up  Updated: 07/25/2022  Patient will be compliant with basic HEP program to ensure understanding of exercise  Baseline: Working on pelvic floor coordination becoming more natural when performing exercises Goal status: IN PROGRESS   LONG TERM GOALS: Target date: 09/19/2022 Updated: 07/25/2022   Patient will be able maintain a pelvic floor contraction for 15 seconds to ensure functional pelvic floor endurance to help with urinary incontinence  Baseline: 2 seconds Goal status: IN PROGRESS  2.  Patient will report the use of 1-2 pads throughout day to demonstrate greater muscle coordination of pelvic floor Baseline: 3-4 a day  Goal status: IN PROGRESS   3.  Patient will be independent with advance HEP to maintain improvements made throughout therapy  Baseline:  Goal status: INITIAL  4.  Patient will  be able to do community activities without having the fear to find the restroom.  Baseline:  Goal status: INITIAL  5.  Patient will be able to squat 20# without having leakage to demonstrate proper pelvic floor engagement  Baseline:  Goal status: INITIAL    PLAN: PT FREQUENCY: 1x/week  PT DURATION: 12 weeks  PLANNED INTERVENTIONS: Therapeutic exercises, Therapeutic activity, Neuromuscular re-education, Balance training, Gait training, Patient/Family education, Self Care, Joint mobilization, Stair training, Dry Needling, Cryotherapy, Moist heat, Taping, Biofeedback, and Manual therapy  PLAN FOR NEXT SESSION: Pelvic floor isolation with activities, Hip and glute strengthening, TA activation functional activities    Minda Faas, Student-PT 08/01/2022  2:53 PM   I agree with the following treatment note after reviewing documentation. This session was performed under the supervision of a licensed clinician.  Gustavus Bryant, PT 08/01/22 2:53 PM

## 2022-08-04 ENCOUNTER — Other Ambulatory Visit: Payer: Self-pay | Admitting: Nurse Practitioner

## 2022-08-04 DIAGNOSIS — G4700 Insomnia, unspecified: Secondary | ICD-10-CM

## 2022-08-04 NOTE — Telephone Encounter (Signed)
Last annual exam 10/2021

## 2022-08-10 ENCOUNTER — Ambulatory Visit: Payer: Federal, State, Local not specified - PPO | Attending: Nurse Practitioner | Admitting: Physical Therapy

## 2022-08-10 ENCOUNTER — Encounter: Payer: Self-pay | Admitting: Physical Therapy

## 2022-08-10 DIAGNOSIS — M6281 Muscle weakness (generalized): Secondary | ICD-10-CM | POA: Diagnosis not present

## 2022-08-10 DIAGNOSIS — N3942 Incontinence without sensory awareness: Secondary | ICD-10-CM | POA: Diagnosis not present

## 2022-08-10 NOTE — Therapy (Signed)
OUTPATIENT PHYSICAL THERAPY FEMALE PELVIC TREATMENT   Patient Name: [name] MRN: [id number] DOB:March 29, 1961, 61 y.o., female Today's Date: 08/10/2022   PT End of Session - 08/10/22 1017     Visit Number 4    Number of Visits 6    Date for PT Re-Evaluation 09/19/22    Authorization Type BCBS    PT Start Time [phone]    PT Stop Time 1018    PT Time Calculation (min) 47 min    Activity Tolerance Patient tolerated treatment well    Behavior During Therapy [facility] for tasks assessed/performed                Past Medical History:  Diagnosis Date   Arthritis    Cancer ([place])    skin cancer chest   Cervical high risk HPV (human papillomavirus) test positive 11/2012   Normal cytology recommend repeat Pap with HPV one year   LGSIL (low grade squamous intraepithelial dysplasia)    07/2001   Urinary incontinence    USI   Past Surgical History:  Procedure Laterality Date   COLPOSCOPY     HAND SURGERY  2012   RIGHT HAND MIDDLE FINGER   LASIK     [place]   TONSILLECTOMY AND ADENOIDECTOMY     Patient Active Problem List   Diagnosis Date Noted   Vitamin D deficiency 12/03/2020   Dysuria 12/03/2020   Urinary frequency 12/03/2020   COVID-19 virus infection 10/08/2019   Hypertriglyceridemia 10/08/2019   Encounter for well adult exam with abnormal findings 10/08/2019   HLD (hyperlipidemia) 10/08/2019   Blood pressure elevated without history of HTN 10/08/2019    PCP: Biagio Borg, MD   REFERRING PROVIDER: Tamela Gammon , NP  REFERRING DIAG: Overactive Bladder    THERAPY DIAG:  Urinary incontinence without sensory awareness  Muscle weakness (generalized)  Rationale for Evaluation and Treatment Rehabilitation  ONSET DATE: 08/10/2022   SUBJECTIVE:                                                                                                                                                                                           SUBJECTIVE  STATEMENT: She is still having trouble with the breathing. She has been doing more piliates. She states the leakage is the same. She states she is not have leakage ever time when she sneezes and not every time with agility exercises as before.    Fluid intake: Yes: 5x 16 oz   cup hot tea in morning and at lunch    PAIN:  Are you having pain? No   PRECAUTIONS: None  WEIGHT  BEARING RESTRICTIONS No  FALLS:  Has patient fallen in last 6 months? No  LIVING ENVIRONMENT: Lives with: lives with their family and lives with their spouse Lives in: House/apartment Stairs: Yes: Internal: 2 flights steps;   Has following equipment at home: None  OCCUPATION: Retired   PLOF: Independent physically active everyday   PATIENT GOALS Not to have to focus on where to use the bathroom.   PERTINENT HISTORY:   Sexual abuse: No  BOWEL MOVEMENT Pain with bowel movement: No Type of bowel movement:Type (Bristol Stool Scale) 2/3 Fully empty rectum: Yes:   Leakage: No Pads: No Fiber supplement:   URINATION Pain with urination: No history of bladder infection  Fully empty bladder: Yes: takes longer  Stream:  Medium stream  Urgency: Yes: in the morning  Frequency: morning every 30 min and not as frequent in the evenings  Leakage: Sneezing, hiking,lifting, and jumping  Pads: Yes: 3/4 a day   INTERCOURSE Pain with intercourse:  no  Ability to have vaginal penetration:  Yes:   Climax: yes   PREGNANCY Vaginal deliveries 2 Tearing Yes: w/ stitches  Currently pregnant No  PROLAPSE None    OBJECTIVE:   PATIENT SURVEYS:   PFIQ-7    COGNITION:  Overall cognitive status: Within functional limits for tasks assessed     MUSCLE LENGTH: Hamstrings: Right 80 deg; Left 80 deg  LUMBAR SPECIAL TESTS:  Straight leg raise test: Negative  FUNCTIONAL TESTS:  Single leg stand- right SIJ unlocked   GAIT:  Comments: WFL               POSTURE: anterior pelvic tilt and trunk shifted to  right    PELVIC ALIGNMENT: Anterior rotation of left in standing; left ilium upslip in supine   LUMBARAROM/PROM  A/PROM A/PROM  eval  Flexion 75%  Extension   Right lateral flexion   Left lateral flexion   Right rotation   Left rotation    (Blank rows = not tested)  LOWER EXTREMITY ROM:  Passive ROM Right eval Left eval  Hip flexion    Hip extension    Hip abduction    Hip adduction    Hip internal rotation    Hip external rotation  75%  Knee flexion    Knee extension    Ankle dorsiflexion    Ankle plantarflexion    Ankle inversion    Ankle eversion     (Blank rows = not tested)  LOWER EXTREMITY MMT:  MMT Right eval Left eval  Hip flexion 5/5   Hip extension    Hip abduction 4+/5   Hip adduction 5/5   Hip internal rotation    Hip external rotation    Knee flexion    Knee extension    Ankle dorsiflexion    Ankle plantarflexion    Ankle inversion    Ankle eversion      PALPATION:   General: Lumbar paraspinals tight                External Perineal Exam normal but not a lot of movement with contraction and bulging                              Internal Pelvic Floor 2/5 mmt, 10 quick flicks, 2 second hold, slow relaxation, with contraction posterior pelvic til with glute and abdominal contraction          Patient confirms identification and approves PT to  assess internal pelvic floor and treatment Yes No emotional/communication barriers or cognitive limitation. Patient is motivated to learn. Patient understands and agrees with treatment goals and plan. PT explains patient will be examined in standing, sitting, and lying down to see how their muscles and joints work. When they are ready, they will be asked to remove their underwear so PT can examine their perineum. The patient is also given the option of providing their own chaperone as one is not provided in our facility. The patient also has the right and is explained the right to defer or refuse any part of  the evaluation or treatment including the internal exam. With the patient's consent, PT will use one gloved finger to gently assess the muscles of the pelvic floor, seeing how well it contracts and relaxes and if there is muscle symmetry. After, the patient will get dressed and PT and patient will discuss exam findings and plan of care. PT and patient discuss plan of care, schedule, attendance policy and HEP activities.   PELVIC MMT:   MMT eval  Vaginal 2/5   Internal Anal Sphincter   External Anal Sphincter   Puborectalis   Diastasis Recti 1 finger from umbilicus and xiphoid process   (Blank rows = not tested)        TONE: normal  PROLAPSE: n/a  TODAY'S TREATMENT 08/10/2022 Exercise: Supine marches with arm press down with Diaphragmatic breathing  and pelvic floor contraction  Dead bug holds with Diaphragmatic breathing  and pelvic floor contraction 2x30s Hooklying with arms reaching overhead 1x12 [place] with 13# cable machine- Diaphragmatic breathing   Bungee walking forward and back Bungee resisted marches 1x10 bil  Mini lunges on 2 foam pads 1x12 bil Tall knee to half with 2#  Tall knee to half kneel with resisted 3# cable machine sing le arm  90/90 toe taps 1x15 bil Deadbug 1x10 Vibration plate squats 30 sec  Hamstring stretch 30sec Vibration plate  Hip flexor stretch 30 sec bil vibration plate  Posterior pelvic tilt on wall left arms 1x12 Self care: Educated on [employer] and how to use with exercises as well as purchase information   08-28-22 Exercise: Dead bug hold 2x 30sec Hooklying with arms reaching overhead 1x12 90/90 toe taps 1x15 bil Deadbug 20x10  Foam roller marches Foam roller dead bug  QPED Alternating UE 1x10 bil Alternating LE 1x10 bil Alternating UE/LE 1x10 bil Seated Sit to stands 16# with Diaphragmatic breathing   Diaphragmatic breathing   Standing  Wall squat with arm reaches 1x10  Wall squat with arm reaching with 8 pound     PATIENT EDUCATION:  Education details:HEP, kegel weights  Person educated: Patient Education method: Explanation, Demonstration, and Handouts Education comprehension: verbalized understanding   HOME EXERCISE PROGRAM: Access Code: [email or web address] URL: [email or web address] Date: 07/25/2022 Prepared by: Jari Favre Exercises - kegel with hip internal rotation - 1 x daily - 7 x weekly - 1 sets - 10 reps - Prone Pelvic Floor Contraction on Swiss Ball - 1 x daily - 7 x weekly - 1 sets - 10 reps - Quadruped Circle Weight Shifts - 1 x daily - 7 x weekly - 3 sets - 10 reps - Swiss Ball March - 1 x daily - 7 x weekly - 3 sets - 10 reps - Quadruped Cat Cow - 1 x daily - 7 x weekly - 3 sets - 10 reps  ASSESSMENT:  CLINICAL IMPRESSION: Pt responded well to therapy today. Today's session focused on  progressing  pelvic floor coordination and TA activation in different positions.We tried more function activities with emphasis on engagement of the pelvic floor muscles. She required moderate tactile and verbal cueing of coordination of breathing and pelvic floor muscles. Patient was educated on pelvic floor weights and purchasing to provide the tactile curing when exercising.  Patient would benefit from continue skilled therapy to continue to work on pelvic floor coordination and strength for a better QOL.   OBJECTIVE IMPAIRMENTS decreased coordination, decreased endurance, decreased knowledge of condition, decreased ROM, decreased strength, impaired flexibility, and impaired tone.   ACTIVITY LIMITATIONS carrying, lifting, bending, squatting, stairs, and continence  PARTICIPATION LIMITATIONS: community activity and yard work  [place] Age and Time since onset of injury/illness/exacerbation are also affecting patient's functional outcome.   REHAB POTENTIAL: Good  CLINICAL DECISION MAKING: Evolving/moderate complexity  EVALUATION COMPLEXITY: Moderate   GOALS: Goals reviewed  with patient? Yes  SHORT TERM GOALS: Target date: follow up  Updated: 07/25/2022  Patient will be compliant with basic HEP program to ensure understanding of exercise  Baseline: Working on pelvic floor coordination becoming more natural when performing exercises Goal status: IN PROGRESS   LONG TERM GOALS: Target date: 09/19/2022 Updated: 07/25/2022   Patient will be able maintain a pelvic floor contraction for 15 seconds to ensure functional pelvic floor endurance to help with urinary incontinence  Baseline: 2 seconds Goal status: IN PROGRESS  2.  Patient will report the use of 1-2 pads throughout day to demonstrate greater muscle coordination of pelvic floor Baseline: 3-4 a day  Goal status: IN PROGRESS   3.  Patient will be independent with advance HEP to maintain improvements made throughout therapy  Baseline:  Goal status: INITIAL  4.  Patient will be able to do community activities without having the fear to find the restroom.  Baseline:  Goal status: INITIAL  5.  Patient will be able to squat 20# without having leakage to demonstrate proper pelvic floor engagement  Baseline:  Goal status: INITIAL    PLAN: PT FREQUENCY: 1x/week  PT DURATION: 12 weeks  PLANNED INTERVENTIONS: Therapeutic exercises, Therapeutic activity, Neuromuscular re-education, Balance training, Gait training, Patient/Family education, Self Care, Joint mobilization, Stair training, Dry Needling, Cryotherapy, Moist heat, Taping, Biofeedback, and Manual therapy  PLAN FOR NEXT SESSION: Pelvic floor isolation with activities, Hip and glute strengthening, TA activation functional activities, dry needling lumbar for improved trunk mobility, f/u of Kegel weights     Lauralee Waters, Student-PT 08/10/2022  11:35 AM   I agree with the following treatment note after reviewing documentation. This session was performed under the supervision of a licensed clinician.  Gustavus Bryant, PT 08/10/22 11:35 AM

## 2022-08-15 ENCOUNTER — Encounter: Payer: Federal, State, Local not specified - PPO | Admitting: Physical Therapy

## 2022-08-29 ENCOUNTER — Encounter: Payer: Self-pay | Admitting: Physical Therapy

## 2022-08-29 ENCOUNTER — Ambulatory Visit: Payer: Federal, State, Local not specified - PPO | Admitting: Physical Therapy

## 2022-08-29 DIAGNOSIS — M6281 Muscle weakness (generalized): Secondary | ICD-10-CM

## 2022-08-29 DIAGNOSIS — N3942 Incontinence without sensory awareness: Secondary | ICD-10-CM

## 2022-08-29 NOTE — Therapy (Signed)
OUTPATIENT PHYSICAL THERAPY FEMALE PELVIC TREATMENT   Patient Name: [name] MRN: [id number] DOB:08-01-1961, 61 y.o., female Today's Date: 08/29/2022   PT End of Session - 08/29/22 1407     Visit Number 5    Number of Visits 6    Date for PT Re-Evaluation 09/19/22    Authorization Type BC[employer]    PT Start Time 1404    PT Stop Time 1749    PT Time Calculation (min) 42 min    Activity Tolerance Patient tolerated treatment well    Behavior During Therapy [facility] for tasks assessed/performed                 Past Medical History:  Diagnosis Date   Arthritis    Cancer ([place])    skin cancer chest   Cervical high risk HPV (human papillomavirus) test positive 11/2012   Normal cytology recommend repeat Pap with HPV one year   LGSIL (low grade squamous intraepithelial dysplasia)    07/2001   Urinary incontinence    USI   Past Surgical History:  Procedure Laterality Date   COLPOSCOPY     HAND SURGERY  2012   RIGHT HAND MIDDLE FINGER   LASIK     [place]   TONSILLECTOMY AND ADENOIDECTOMY     Patient Active Problem List   Diagnosis Date Noted   Vitamin D deficiency 12/03/2020   Dysuria 12/03/2020   Urinary frequency 12/03/2020   COVID-19 virus infection 10/08/2019   Hypertriglyceridemia 10/08/2019   Encounter for well adult exam with abnormal findings 10/08/2019   HLD (hyperlipidemia) 10/08/2019   Blood pressure elevated without history of HTN 10/08/2019    PCP: Biagio Borg, MD   REFERRING PROVIDER: Tamela Gammon , NP  REFERRING DIAG: Overactive Bladder    THERAPY DIAG:  Urinary incontinence without sensory awareness  Muscle weakness (generalized)  Rationale for Evaluation and Treatment Rehabilitation  ONSET DATE: 08/29/2022   SUBJECTIVE:                                                                                                                                                                                           SUBJECTIVE  STATEMENT: She having the leakage with the sneezing. She had to lift a couple a heavy thing and had some leakage. She can do squats and burpees and did not have any leakage.   Fluid intake: Yes: 5x 16 oz   cup hot tea in morning and at lunch    PAIN:  Are you having pain? No   PRECAUTIONS: None  WEIGHT BEARING RESTRICTIONS No  FALLS:  Has patient fallen in  last 6 months? No  LIVING ENVIRONMENT: Lives with: lives with their family and lives with their spouse Lives in: House/apartment Stairs: Yes: Internal: 2 flights steps;   Has following equipment at home: None  OCCUPATION: Retired   PLOF: Independent physically active everyday   PATIENT GOALS Not to have to focus on where to use the bathroom.   PERTINENT HISTORY:   Sexual abuse: No  BOWEL MOVEMENT Pain with bowel movement: No Type of bowel movement:Type (Bristol Stool Scale) 2/3 Fully empty rectum: Yes:   Leakage: No Pads: No Fiber supplement:   URINATION Pain with urination: No history of bladder infection  Fully empty bladder: Yes: takes longer  Stream:  Medium stream  Urgency: Yes: in the morning  Frequency: morning every 30 min and not as frequent in the evenings  Leakage: Sneezing, hiking,lifting, and jumping  Pads: Yes: 3/4 a day   INTERCOURSE Pain with intercourse:  no  Ability to have vaginal penetration:  Yes:   Climax: yes   PREGNANCY Vaginal deliveries 2 Tearing Yes: w/ stitches  Currently pregnant No  PROLAPSE None    OBJECTIVE:   PATIENT SURVEYS:   PFIQ-7    COGNITION:  Overall cognitive status: Within functional limits for tasks assessed     MUSCLE LENGTH: Hamstrings: Right 80 deg; Left 80 deg  LUMBAR SPECIAL TESTS:  Straight leg raise test: Negative  FUNCTIONAL TESTS:  Single leg stand- right SIJ unlocked   GAIT:  Comments: WFL               POSTURE: anterior pelvic tilt and trunk shifted to right    PELVIC ALIGNMENT: Anterior rotation of left in standing;  left ilium upslip in supine   LUMBARAROM/PROM  A/PROM A/PROM  eval  Flexion 75%  Extension   Right lateral flexion   Left lateral flexion   Right rotation   Left rotation    (Blank rows = not tested)  LOWER EXTREMITY ROM:  Passive ROM Right eval Left eval  Hip flexion    Hip extension    Hip abduction    Hip adduction    Hip internal rotation    Hip external rotation  75%  Knee flexion    Knee extension    Ankle dorsiflexion    Ankle plantarflexion    Ankle inversion    Ankle eversion     (Blank rows = not tested)  LOWER EXTREMITY MMT:  MMT Right eval Left eval  Hip flexion 5/5   Hip extension    Hip abduction 4+/5   Hip adduction 5/5   Hip internal rotation    Hip external rotation    Knee flexion    Knee extension    Ankle dorsiflexion    Ankle plantarflexion    Ankle inversion    Ankle eversion      PALPATION:   General: Lumbar paraspinals tight                External Perineal Exam normal but not a lot of movement with contraction and bulging                              Internal Pelvic Floor 2/5 mmt, 10 quick flicks, 2 second hold, slow relaxation, with contraction posterior pelvic til with glute and abdominal contraction          Patient confirms identification and approves PT to assess internal pelvic floor and treatment Yes No emotional/communication barriers  or cognitive limitation. Patient is motivated to learn. Patient understands and agrees with treatment goals and plan. PT explains patient will be examined in standing, sitting, and lying down to see how their muscles and joints work. When they are ready, they will be asked to remove their underwear so PT can examine their perineum. The patient is also given the option of providing their own chaperone as one is not provided in our facility. The patient also has the right and is explained the right to defer or refuse any part of the evaluation or treatment including the internal exam. With the  patient's consent, PT will use one gloved finger to gently assess the muscles of the pelvic floor, seeing how well it contracts and relaxes and if there is muscle symmetry. After, the patient will get dressed and PT and patient will discuss exam findings and plan of care. PT and patient discuss plan of care, schedule, attendance policy and HEP activities.   PELVIC MMT:   MMT eval  Vaginal 2/5   Internal Anal Sphincter   External Anal Sphincter   Puborectalis   Diastasis Recti 1 finger from umbilicus and xiphoid process   (Blank rows = not tested)        TONE: normal  PROLAPSE: n/a  TODAY'S TREATMENT 08/10/2022 Manual therapy: STM to lumbar paraspinals  Trigger Point Dry-Needling  Treatment instructions: Expect mild to moderate muscle soreness. S/S of pneumothorax if dry needled over a lung field, and to seek immediate medical attention should they occur. Patient verbalized understanding of these instructions and education.  Patient Consent Given: Yes Education handout provided: Yes Muscles treated: lumbar multifidi  Treatment response/outcome: increase in muscle length and decrease muscle   Exercise: Supine marches with arm press down with Diaphragmatic breathing  and pelvic floor contraction  Dead bug holds with Diaphragmatic breathing  and pelvic floor contraction 2x30s Hip abduction walking with gtb  Backwards walking with green theraband Forward walking with GTB Deadbug 1x10 Stretches: Hip circles  Cat cow  Lumbar supine rotation  Hamstring stretch 30sec Vibration plate  Hip flexor stretch 30 sec bil vibration plate   20/06/4708 Exercise: Supine marches with arm press down with Diaphragmatic breathing  and pelvic floor contraction  Dead bug holds with Diaphragmatic breathing  and pelvic floor contraction 2x30s Hooklying with arms reaching overhead 1x12 [place] with 13# cable machine- Diaphragmatic breathing   Bungee walking forward and back Bungee resisted marches  1x10 bil  Mini lunges on 2 foam pads 1x12 bil Tall knee to half with 2#  Tall knee to half kneel with resisted 3# cable machine sing le arm  90/90 toe taps 1x15 bil Deadbug 1x10 Vibration plate squats 30 sec  Hamstring stretch 30sec Vibration plate  Hip flexor stretch 30 sec bil vibration plate  Posterior pelvic tilt on wall left arms 1x12 Self care: Educated on [employer] and how to use with exercises as well as purchase information   08/10/22 Exercise: Dead bug hold 2x 30sec Hooklying with arms reaching overhead 1x12 90/90 toe taps 1x15 bil Deadbug 20x10  Foam roller marches Foam roller dead bug  QPED Alternating UE 1x10 bil Alternating LE 1x10 bil Alternating UE/LE 1x10 bil Seated Sit to stands 16# with Diaphragmatic breathing   Diaphragmatic breathing   Standing  Wall squat with arm reaches 1x10  Wall squat with arm reaching with 8 pound    PATIENT EDUCATION:  Education details:HEP, kegel [employer]  Person educated: Patient Education method: Explanation, Media planner, and Handouts Education  comprehension: verbalized understanding   HOME EXERCISE PROGRAM: Access Code: [email or web address] URL: [email or web address] Date: 07/25/2022 Prepared by: Jari Favre Exercises - kegel with hip internal rotation - 1 x daily - 7 x weekly - 1 sets - 10 reps - Prone Pelvic Floor Contraction on Swiss Ball - 1 x daily - 7 x weekly - 1 sets - 10 reps - Quadruped Circle Weight Shifts - 1 x daily - 7 x weekly - 3 sets - 10 reps - Swiss Ball March - 1 x daily - 7 x weekly - 3 sets - 10 reps - Quadruped Cat Cow - 1 x daily - 7 x weekly - 3 sets - 10 reps  ASSESSMENT:  CLINICAL IMPRESSION: Pt responded well to therapy today. Today's session focused on STM of the lumbar paraspinals, hip strengthening,  progressing pelvic floor coordination and TA activation in different positions.We incorporated more function activities with emphasis on engagement of the pelvic floor  muscles. She required minimal tactile and verbal cueing of coordination of breathing and pelvic floor muscles and more cueing to slow down and have proper technique.Patient had some release with the trigger point release and will follow up next time on how long it lasted.  Patient would benefit from continue skilled therapy to continue to work on pelvic floor coordination and strength for a better QOL.   OBJECTIVE IMPAIRMENTS decreased coordination, decreased endurance, decreased knowledge of condition, decreased ROM, decreased strength, impaired flexibility, and impaired tone.   ACTIVITY LIMITATIONS carrying, lifting, bending, squatting, stairs, and continence  PARTICIPATION LIMITATIONS: community activity and yard work  [place] Age and Time since onset of injury/illness/exacerbation are also affecting patient's functional outcome.   REHAB POTENTIAL: Good  CLINICAL DECISION MAKING: Evolving/moderate complexity  EVALUATION COMPLEXITY: Moderate   GOALS: Goals reviewed with patient? Yes  SHORT TERM GOALS: Target date: follow up  Updated: 08/29/2022   Patient will be compliant with basic HEP program to ensure understanding of exercise  Baseline: doing 6 days a week  Goal status: MET   LONG TERM GOALS: Target date: 09/19/2022 Updated: 08/29/2022   Patient will be able maintain a pelvic floor contraction for 15 seconds to ensure functional pelvic floor endurance to help with urinary incontinence  Baseline: 2 seconds Goal status: IN PROGRESS  2.  Patient will report the use of 1-2 pads throughout day to demonstrate greater muscle coordination of pelvic floor Baseline: 2-3 on average Goal status: IN PROGRESS   3.  Patient will be independent with advance HEP to maintain improvements made throughout therapy  Baseline:  Goal status: INITIAL  4.  Patient will be able to do community activities without having the fear to find the restroom.  Baseline: She is still having to  find nearest Goal status: INITIAL  5.  Patient will be able to squat 20# without having leakage to demonstrate proper pelvic floor engagement  Baseline: She able yo squats and burpees  Goal status: INITIAL    PLAN: PT FREQUENCY: 1x/week  PT DURATION: 12 weeks  PLANNED INTERVENTIONS: Therapeutic exercises, Therapeutic activity, Neuromuscular re-education, Balance training, Gait training, Patient/Family education, Self Care, Joint mobilization, Stair training, Dry Needling, Cryotherapy, Moist heat, Taping, Biofeedback, and Manual therapy  PLAN FOR NEXT SESSION: Hip and glute strengthening, squats with increased weight, lifting mechanics with pelvic floor contractions, and f/u with dry needling     [name], Student-PT 08/29/2022  2:52 PM   I agree with the following treatment note after reviewing documentation. This session was performed under the  supervision of a licensed clinician.  [name], PT 08/29/22 2:52 PM

## 2022-09-07 ENCOUNTER — Ambulatory Visit: Payer: Federal, State, Local not specified - PPO | Attending: Nurse Practitioner | Admitting: Physical Therapy

## 2022-09-07 ENCOUNTER — Encounter: Payer: Self-pay | Admitting: Physical Therapy

## 2022-09-07 DIAGNOSIS — N3942 Incontinence without sensory awareness: Secondary | ICD-10-CM | POA: Diagnosis not present

## 2022-09-07 DIAGNOSIS — M6281 Muscle weakness (generalized): Secondary | ICD-10-CM | POA: Insufficient documentation

## 2022-09-07 NOTE — Therapy (Signed)
OUTPATIENT PHYSICAL THERAPY FEMALE PELVIC TREATMENT   Patient Name: [name] MRN: [id number] DOB:22-Apr-1961, 61 y.o., female Today's Date: 09/07/2022   PT End of Session - 09/07/22 1358     Visit Number 6    Number of Visits 6    Date for PT Re-Evaluation 09/19/22    Authorization Type [employer]    PT Start Time 1400    PT Stop Time 1446    PT Time Calculation (min) 46 min    Activity Tolerance Patient tolerated treatment well    Behavior During Therapy [facility] for tasks assessed/performed                  Past Medical History:  Diagnosis Date   Arthritis    Cancer ([place])    skin cancer chest   Cervical high risk HPV (human papillomavirus) test positive 11/2012   Normal cytology recommend repeat Pap with HPV one year   LGSIL (low grade squamous intraepithelial dysplasia)    07/2001   Urinary incontinence    [facility]   Past Surgical History:  Procedure Laterality Date   COLPOSCOPY     HAND SURGERY  2012   RIGHT HAND MIDDLE FINGER   LASIK     [place]   TONSILLECTOMY AND ADENOIDECTOMY     Patient Active Problem List   Diagnosis Date Noted   Vitamin D deficiency 12/03/2020   Dysuria 12/03/2020   Urinary frequency 12/03/2020   COVID-19 virus infection 10/08/2019   Hypertriglyceridemia 10/08/2019   Encounter for well adult exam with abnormal findings 10/08/2019   HLD (hyperlipidemia) 10/08/2019   Blood pressure elevated without history of HTN 10/08/2019    PCP: Biagio Borg, MD   REFERRING PROVIDER: Tamela Gammon , NP  REFERRING DIAG: Overactive Bladder    THERAPY DIAG:  Urinary incontinence without sensory awareness  Muscle weakness (generalized)  Rationale for Evaluation and Treatment Rehabilitation  ONSET DATE: 09/07/2022   SUBJECTIVE:                                                                                                                                                                                           SUBJECTIVE  STATEMENT: 09/07/2022:  The dry needling helped a lot. She had a sneezing fit and didn't have any leakage. She then sneezed and had some leakage and had to change her pad. She feels like all her leakage accidents happen when she is sneezing     Fluid intake: Yes: 5x 16 oz   cup hot tea in morning and at lunch    PAIN:  Are you having pain? No  PRECAUTIONS: None  WEIGHT BEARING RESTRICTIONS No  FALLS:  Has patient fallen in last 6 months? No  LIVING ENVIRONMENT: Lives with: lives with their family and lives with their spouse Lives in: House/apartment Stairs: Yes: Internal: 2 flights steps;   Has following equipment at home: None  OCCUPATION: Retired   PLOF: Independent physically active everyday   PATIENT GOALS Not to have to focus on where to use the bathroom.   PERTINENT HISTORY:   Sexual abuse: No  BOWEL MOVEMENT Pain with bowel movement: No Type of bowel movement:Type (B[place] Stool Scale) 2/3 Fully empty rectum: Yes:   Leakage: No Pads: No Fiber supplement:   URINATION Pain with urination: No history of bladder infection  Fully empty bladder: Yes: takes longer  Stream:  Medium stream  Urgency: Yes: in the morning  Frequency: morning every 30 min and not as frequent in the evenings  Leakage: Sneezing, hiking,lifting, and jumping  Pads: Yes: 3/4 a day   INTERCOURSE Pain with intercourse:  no  Ability to have vaginal penetration:  Yes:   Climax: yes   PREGNANCY Vaginal deliveries 2 Tearing Yes: w/ stitches  Currently pregnant No  PROLAPSE None    OBJECTIVE:   PATIENT SURVEYS:   PFIQ-7    COGNITION:  Overall cognitive status: Within functional limits for tasks assessed     MUSCLE LENGTH: Hamstrings: Right 80 deg; Left 80 deg  LUMBAR SPECIAL TESTS:  Straight leg raise test: Negative  FUNCTIONAL TESTS:  Single leg stand- right SIJ unlocked   GAIT:  Comments: WFL               POSTURE: anterior pelvic tilt and trunk shifted to  right    PELVIC ALIGNMENT: Anterior rotation of left in standing; left ilium upslip in supine   LUMBARAROM/PROM  A/PROM A/PROM  eval  Flexion 75%  Extension   Right lateral flexion   Left lateral flexion   Right rotation   Left rotation    (Blank rows = not tested)  LOWER EXTREMITY ROM:  Passive ROM Right eval Left eval  Hip flexion    Hip extension    Hip abduction    Hip adduction    Hip internal rotation    Hip external rotation  75%  Knee flexion    Knee extension    Ankle dorsiflexion    Ankle plantarflexion    Ankle inversion    Ankle eversion     (Blank rows = not tested)  LOWER EXTREMITY MMT:  MMT Right eval Left eval  Hip flexion 5/5   Hip extension    Hip abduction 4+/5   Hip adduction 5/5   Hip internal rotation    Hip external rotation    Knee flexion    Knee extension    Ankle dorsiflexion    Ankle plantarflexion    Ankle inversion    Ankle eversion      PALPATION:   General: Lumbar paraspinals tight                External Perineal Exam normal but not a lot of movement with contraction and bulging                              Internal Pelvic Floor 2/5 mmt, 10 quick flicks, 2 second hold, slow relaxation, with contraction posterior pelvic til with glute and abdominal contraction          Patient confirms identification  and approves PT to assess internal pelvic floor and treatment Yes No emotional/communication barriers or cognitive limitation. Patient is motivated to learn. Patient understands and agrees with treatment goals and plan. PT explains patient will be examined in standing, sitting, and lying down to see how their muscles and joints work. When they are ready, they will be asked to remove their underwear so PT can examine their perineum. The patient is also given the option of providing their own chaperone as one is not provided in our facility. The patient also has the right and is explained the right to defer or refuse any part of  the evaluation or treatment including the internal exam. With the patient's consent, PT will use one gloved finger to gently assess the muscles of the pelvic floor, seeing how well it contracts and relaxes and if there is muscle symmetry. After, the patient will get dressed and PT and patient will discuss exam findings and plan of care. PT and patient discuss plan of care, schedule, attendance policy and HEP activities.   PELVIC MMT:   MMT eval 09/07/2022  Vaginal 2/5  2/5, 12 seconds   Internal Anal Sphincter    External Anal Sphincter    Puborectalis    Diastasis Recti 1 finger from umbilicus and xiphoid process    (Blank rows = not tested)        TONE: normal  PROLAPSE: n/a  TODAY'S TREATMENT 09/07/2022 Manual therapy: Patient confirms identification and approved physical therapist to perform muscle strength and integrity assessment - reassessment of pelvic floor strength and endurance  STM to lumbar paraspinals and right glute   Trigger Point Dry-Needling  Treatment instructions: Expect mild to moderate muscle soreness. S/S of pneumothorax if dry needled over a lung field, and to seek immediate medical attention should they occur. Patient verbalized understanding of these instructions and education.  Patient Consent Given: Yes Education handout provided: Yes Muscles treated: lumbar multifidi and right glute med  Treatment response/outcome: increase in muscle length and decrease muscle   Exercise: Leg press 70# 2x12 Hip abductor machine 2x12 bil 60# Hip flexion 1x12 60# Hip extension 1x12 60# Pallof 1x12 green theraband  Stretches: Lumbar rotation  Single leg hold  Bear hold rocks   08/10/2022 Exercise: Supine marches with arm press down with Diaphragmatic breathing  and pelvic floor contraction  Dead bug holds with Diaphragmatic breathing  and pelvic floor contraction 2x30s Hooklying with arms reaching overhead 1x12 Pallof with 13# cable machine- Diaphragmatic  breathing   Bungee walking forward and back Bungee resisted marches 1x10 bil  Mini lunges on 2 foam pads 1x12 bil Tall knee to half with 2#  Tall knee to half kneel with resisted 3# cable machine sing le arm  90/90 toe taps 1x15 bil Deadbug 1x10 Vibration plate squats 30 sec  Hamstring stretch 30sec Vibration plate  Hip flexor stretch 30 sec bil vibration plate  Posterior pelvic tilt on wall left arms 1x12 Self care: Educated on [employer] and how to use with exercises as well as purchase information   August 04, 2022 Exercise: Dead bug hold 2x 30sec Hooklying with arms reaching overhead 1x12 90/90 toe taps 1x15 bil Deadbug 20x10  Foam roller marches Foam roller dead bug  QPED Alternating UE 1x10 bil Alternating LE 1x10 bil Alternating UE/LE 1x10 bil Seated Sit to stands 16# with Diaphragmatic breathing   Diaphragmatic breathing   Standing  Wall squat with arm reaches 1x10  Wall squat with arm reaching with 8 pound  PATIENT EDUCATION:  Education details:HEP, kegel weights  Person educated: Patient Education method: Explanation, Demonstration, and Handouts Education comprehension: verbalized understanding   HOME EXERCISE PROGRAM: Access Code: [email or web address] URL: [email or web address] Date: 07/25/2022 Prepared by: Jari Favre Exercises - kegel with hip internal rotation - 1 x daily - 7 x weekly - 1 sets - 10 reps - Prone Pelvic Floor Contraction on Swiss Ball - 1 x daily - 7 x weekly - 1 sets - 10 reps - Quadruped Circle Weight Shifts - 1 x daily - 7 x weekly - 3 sets - 10 reps - Swiss Ball March - 1 x daily - 7 x weekly - 3 sets - 10 reps - Quadruped Cat Cow - 1 x daily - 7 x weekly - 3 sets - 10 reps  ASSESSMENT:  CLINICAL IMPRESSION: Pt responded well to therapy today. Today's session focused on reassessment of pelvic floor muscles, STM of the lumbar paraspinals, hip strengthening, and TA activation in different positions. During the assessment  patient had an great improvement of pelvic floor endurance increasing time from 2 seconds to 12 second holds. Patient had minor leakage with hip abduction exercise due to laughing while performing exercise. Patient had continued release with the trigger point release and will follow up next time on how long it lasted.  Patient would benefit from continue skilled therapy to continue to work on pelvic floor coordination and strength for a better QOL.   OBJECTIVE IMPAIRMENTS decreased coordination, decreased endurance, decreased knowledge of condition, decreased ROM, decreased strength, impaired flexibility, and impaired tone.   ACTIVITY LIMITATIONS carrying, lifting, bending, squatting, stairs, and continence  PARTICIPATION LIMITATIONS: community activity and yard work  [place] Age and Time since onset of injury/illness/exacerbation are also affecting patient's functional outcome.   REHAB POTENTIAL: Good  CLINICAL DECISION MAKING: Evolving/moderate complexity  EVALUATION COMPLEXITY: Moderate   GOALS: Goals reviewed with patient? Yes  SHORT TERM GOALS: Target date: follow up  Updated: 08/29/2022   Patient will be compliant with basic HEP program to ensure understanding of exercise  Baseline: doing 6 days a week  Goal status: MET   LONG TERM GOALS: Target date: 09/19/2022 Updated: 09/07/2022   Patient will be able maintain a pelvic floor contraction for 15 seconds to ensure functional pelvic floor endurance to help with urinary incontinence  Baseline: 12 seconds Goal status: IN PROGRESS  2.  Patient will report the use of 1-2 pads throughout day to demonstrate greater muscle coordination of pelvic floor Baseline: 2 on average  Goal status: IN PROGRESS   3.  Patient will be independent with advance HEP to maintain improvements made throughout therapy  Baseline:  Goal status: IN PROGRESS  4.  Patient will be able to do community activities without having the fear to  find the restroom.  Baseline: She still have to scope out the bathroom  Goal status: IN PROGRESS  5.  Patient will be able to squat 20# without having leakage to demonstrate proper pelvic floor engagement  Baseline: able to leg press 70# with no leakage  Goal status: IN PROGRESS     PLAN: PT FREQUENCY: 1x/week  PT DURATION: 12 weeks  PLANNED INTERVENTIONS: Therapeutic exercises, Therapeutic activity, Neuromuscular re-education, Balance training, Gait training, Patient/Family education, Self Care, Joint mobilization, Stair training, Dry Needling, Cryotherapy, Moist heat, Taping, Biofeedback, and Manual therapy  PLAN FOR NEXT SESSION: continue Hip and glute strengthening,  review the knack , lifting mechanics with pelvic floor contractions, and f/u with dry needling and how  long the relief last     Rosario Jacks, Student-PT 09/07/2022  2:54 PM   I agree with the following treatment note after reviewing documentation. This session was performed under the supervision of a licensed clinician.  Gustavus Bryant, PT 09/07/22 2:54 PM

## 2022-09-12 ENCOUNTER — Encounter: Payer: Self-pay | Admitting: Physical Therapy

## 2022-09-12 ENCOUNTER — Ambulatory Visit: Payer: Federal, State, Local not specified - PPO | Admitting: Physical Therapy

## 2022-09-12 DIAGNOSIS — M6281 Muscle weakness (generalized): Secondary | ICD-10-CM | POA: Diagnosis not present

## 2022-09-12 DIAGNOSIS — N3942 Incontinence without sensory awareness: Secondary | ICD-10-CM | POA: Diagnosis not present

## 2022-09-12 NOTE — Therapy (Signed)
OUTPATIENT PHYSICAL THERAPY FEMALE PELVIC TREATMENT   Patient Name: [name] MRN: [id number] DOB:1961/06/22, 61 y.o., female Today's Date: 09/12/2022   PT End of Session - 09/12/22 1447     Visit Number 7    Number of Visits 8    Date for PT Re-Evaluation 09/19/22    Authorization Type [employer]    PT Start Time 9628    PT Stop Time 3662    PT Time Calculation (min) 43 min    Activity Tolerance Patient tolerated treatment well    Behavior During Therapy [facility] for tasks assessed/performed                   Past Medical History:  Diagnosis Date   Arthritis    Cancer ([place])    skin cancer chest   Cervical high risk HPV (human papillomavirus) test positive 11/2012   Normal cytology recommend repeat Pap with HPV one year   LGSIL (low grade squamous intraepithelial dysplasia)    07/2001   Urinary incontinence    [facility]   Past Surgical History:  Procedure Laterality Date   COLPOSCOPY     HAND SURGERY  2012   RIGHT HAND MIDDLE FINGER   LASIK     [place]   TONSILLECTOMY AND ADENOIDECTOMY     Patient Active Problem List   Diagnosis Date Noted   Vitamin D deficiency 12/03/2020   Dysuria 12/03/2020   Urinary frequency 12/03/2020   COVID-19 virus infection 10/08/2019   Hypertriglyceridemia 10/08/2019   Encounter for well adult exam with abnormal findings 10/08/2019   HLD (hyperlipidemia) 10/08/2019   Blood pressure elevated without history of HTN 10/08/2019    PCP: Biagio Borg, MD   REFERRING PROVIDER: Tamela Gammon , NP  REFERRING DIAG: Overactive Bladder    THERAPY DIAG:  Urinary incontinence without sensory awareness  Muscle weakness (generalized)  Rationale for Evaluation and Treatment Rehabilitation  ONSET DATE: 09/12/2022   SUBJECTIVE:                                                                                                                                                                                           SUBJECTIVE  STATEMENT: 09/12/2022:  The dry needling didn't make anything worse but it is not better.  The sneezing is still the same and that is when I have the most leakage.     Fluid intake: Yes: 5x 16 oz   cup hot tea in morning and at lunch    PAIN:  Are you having pain? No   PRECAUTIONS: None  WEIGHT BEARING RESTRICTIONS No  FALLS:  Has patient  fallen in last 6 months? No  LIVING ENVIRONMENT: Lives with: lives with their family and lives with their spouse Lives in: House/apartment Stairs: Yes: Internal: 2 flights steps;   Has following equipment at home: None  OCCUPATION: Retired   PLOF: Independent physically active everyday   PATIENT GOALS Not to have to focus on where to use the bathroom.   PERTINENT HISTORY:   Sexual abuse: No  BOWEL MOVEMENT Pain with bowel movement: No Type of bowel movement:Type (Bristol Stool Scale) 2/3 Fully empty rectum: Yes:   Leakage: No Pads: No Fiber supplement:   URINATION Pain with urination: No history of bladder infection  Fully empty bladder: Yes: takes longer  Stream:  Medium stream  Urgency: Yes: in the morning  Frequency: morning every 30 min and not as frequent in the evenings  Leakage: Sneezing, hiking,lifting, and jumping  Pads: Yes: 3/4 a day   INTERCOURSE Pain with intercourse:  no  Ability to have vaginal penetration:  Yes:   Climax: yes   PREGNANCY Vaginal deliveries 2 Tearing Yes: w/ stitches  Currently pregnant No  PROLAPSE None    OBJECTIVE:   PATIENT SURVEYS:   PFIQ-7    COGNITION:  Overall cognitive status: Within functional limits for tasks assessed     MUSCLE LENGTH: Hamstrings: Right 80 deg; Left 80 deg  LUMBAR SPECIAL TESTS:  Straight leg raise test: Negative  FUNCTIONAL TESTS:  Single leg stand- right SIJ unlocked   GAIT:  Comments: WFL               POSTURE: anterior pelvic tilt and trunk shifted to right    PELVIC ALIGNMENT: Anterior rotation of left in standing; left ilium  upslip in supine   LUMBARAROM/PROM  A/PROM A/PROM  eval  Flexion 75%  Extension   Right lateral flexion   Left lateral flexion   Right rotation   Left rotation    (Blank rows = not tested)  LOWER EXTREMITY ROM:  Passive ROM Right eval Left eval  Hip flexion    Hip extension    Hip abduction    Hip adduction    Hip internal rotation    Hip external rotation  75%  Knee flexion    Knee extension    Ankle dorsiflexion    Ankle plantarflexion    Ankle inversion    Ankle eversion     (Blank rows = not tested)  LOWER EXTREMITY MMT:  MMT Right eval Left eval  Hip flexion 5/5   Hip extension    Hip abduction 4+/5   Hip adduction 5/5   Hip internal rotation    Hip external rotation    Knee flexion    Knee extension    Ankle dorsiflexion    Ankle plantarflexion    Ankle inversion    Ankle eversion      PALPATION:   General: Lumbar paraspinals tight                External Perineal Exam normal but not a lot of movement with contraction and bulging                              Internal Pelvic Floor 2/5 mmt, 10 quick flicks, 2 second hold, slow relaxation, with contraction posterior pelvic til with glute and abdominal contraction          Patient confirms identification and approves PT to assess internal pelvic floor and treatment Yes No  emotional/communication barriers or cognitive limitation. Patient is motivated to learn. Patient understands and agrees with treatment goals and plan. PT explains patient will be examined in standing, sitting, and lying down to see how their muscles and joints work. When they are ready, they will be asked to remove their underwear so PT can examine their perineum. The patient is also given the option of providing their own chaperone as one is not provided in our facility. The patient also has the right and is explained the right to defer or refuse any part of the evaluation or treatment including the internal exam. With the patient's  consent, PT will use one gloved finger to gently assess the muscles of the pelvic floor, seeing how well it contracts and relaxes and if there is muscle symmetry. After, the patient will get dressed and PT and patient will discuss exam findings and plan of care. PT and patient discuss plan of care, schedule, attendance policy and HEP activities.   PELVIC MMT:   MMT eval 09/07/2022  Vaginal 2/5  2/5, 12 seconds   Internal Anal Sphincter    External Anal Sphincter    Puborectalis    Diastasis Recti 1 finger from umbilicus and xiphoid process    (Blank rows = not tested)        TONE: normal  PROLAPSE: n/a  TODAY'S  TREATMENT 09/12/2022 Manual therapy: Patient confirms identification and approved physical therapist to perform muscle strength and integrity assessment - reassessment of pelvic floor strength and endurance  STM to lumbar paraspinals and right glute   Trigger Point Dry-Needling  Treatment instructions: Expect mild to moderate muscle soreness. S/S of pneumothorax if dry needled over a lung field, and to seek immediate medical attention should they occur. Patient verbalized understanding of these instructions and education.  Patient Consent Given: Yes Education handout provided: Yes Muscles treated: lumbar multifidi and right glute med  Treatment response/outcome: increase in muscle length and decrease muscle   Exercise: Leg press 90# 2x15 Hip abductor machine 1x15 bil 60# Hip flexion 1x15 60# Hip extension 1x15 60# Pallof 1x12  UE cable - 7lb rotation with lawn mower into lunge Side lunge holding 10lb - 15x bil Diagonal lunge slider - 15x each side  Stretches: Lumbar rotation  Single leg hold  Bear hold rocks Vibration lumbar flexion Vibration hamstring stretch  TREATMENT 09/07/2022 Manual therapy: Patient confirms identification and approved physical therapist to perform muscle strength and integrity assessment - reassessment of pelvic floor strength and  endurance  STM to lumbar paraspinals and right glute   Trigger Point Dry-Needling  Treatment instructions: Expect mild to moderate muscle soreness. S/S of pneumothorax if dry needled over a lung field, and to seek immediate medical attention should they occur. Patient verbalized understanding of these instructions and education.  Patient Consent Given: Yes Education handout provided: Yes Muscles treated: lumbar multifidi and right glute med  Treatment response/outcome: increase in muscle length and decrease muscle   Exercise: Leg press 70# 2x12 Hip abductor machine 2x12 bil 60# Hip flexion 1x12 60# Hip extension 1x12 60# Pallof 1x12 green theraband  Stretches: Lumbar rotation  Single leg hold  Bear hold rocks    08/10/2022 Exercise: Supine marches with arm press down with Diaphragmatic breathing  and pelvic floor contraction  Dead bug holds with Diaphragmatic breathing  and pelvic floor contraction 2x30s Hooklying with arms reaching overhead 1x12 Pallof with 13# cable machine- Diaphragmatic breathing   Bungee walking forward and back Bungee resisted marches 1x10 bil  Mini lunges on 2 foam pads 1x12 bil Tall knee to half with 2#  Tall knee to half kneel with resisted 3# cable machine sing le arm  90/90 toe taps 1x15 bil Deadbug 1x10 Vibration plate squats 30 sec  Hamstring stretch 30sec Vibration plate  Hip flexor stretch 30 sec bil vibration plate  Posterior pelvic tilt on wall left arms 1x12 Self care: Educated on [employer] and how to use with exercises as well as purchase information    PATIENT EDUCATION:  Education details:HEP, added lunge with sliders Person educated: Patient Education method: Consulting civil engineer, Media planner, and Handouts Education comprehension: verbalized understanding   HOME EXERCISE PROGRAM: Access Code: [email or web address] URL: [email or web address] Date: 07/25/2022 Prepared by: Jari Favre Exercises - kegel with hip internal  rotation - 1 x daily - 7 x weekly - 1 sets - 10 reps - Prone Pelvic Floor Contraction on Swiss Ball - 1 x daily - 7 x weekly - 1 sets - 10 reps - Quadruped Circle Weight Shifts - 1 x daily - 7 x weekly - 3 sets - 10 reps - Swiss Ball March - 1 x daily - 7 x weekly - 3 sets - 10 reps - Quadruped Cat Cow - 1 x daily - 7 x weekly - 3 sets - 10 reps  ASSESSMENT:  CLINICAL IMPRESSION: Pt doing well but still having hard time with exhale on exertion and wants to use diaphragh.  She was educated in variety of positions to continue working on exhale with exertion.  Pt's back is feeling better overall, no change from previous visit.  Pt will benefit from skilled PT to work on d/c with successful continuation of HEP.   OBJECTIVE IMPAIRMENTS decreased coordination, decreased endurance, decreased knowledge of condition, decreased ROM, decreased strength, impaired flexibility, and impaired tone.   ACTIVITY LIMITATIONS carrying, lifting, bending, squatting, stairs, and continence  PARTICIPATION LIMITATIONS: community activity and yard work  [employer] Age and Time since onset of injury/illness/exacerbation are also affecting patient's functional outcome.   REHAB POTENTIAL: Good  CLINICAL DECISION MAKING: Evolving/moderate complexity  EVALUATION COMPLEXITY: Moderate   GOALS: Goals reviewed with patient? Yes  SHORT TERM GOALS: Target date: follow up  Updated: 08/29/2022   Patient will be compliant with basic HEP program to ensure understanding of exercise  Baseline: doing 6 days a week  Goal status: MET   LONG TERM GOALS: Target date: 09/19/2022 Updated: 09/07/2022   Patient will be able maintain a pelvic floor contraction for 15 seconds to ensure functional pelvic floor endurance to help with urinary incontinence  Baseline: 12 seconds Goal status: IN PROGRESS  2.  Patient will report the use of 1-2 pads throughout day to demonstrate greater muscle coordination of pelvic  floor Baseline: 2-3 on average  Goal status: IN PROGRESS   3.  Patient will be independent with advance HEP to maintain improvements made throughout therapy  Baseline:  Goal status: IN PROGRESS  4.  Patient will be able to do community activities without having the fear to find the restroom.  Baseline: She still have to scope out the bathroom  Goal status: IN PROGRESS  5.  Patient will be able to squat 20# without having leakage to demonstrate proper pelvic floor engagement  Baseline: able to leg press 90# with no leakage  Goal status: IN PROGRESS     PLAN: PT FREQUENCY: 1x/week  PT DURATION: 12 weeks  PLANNED INTERVENTIONS: Therapeutic exercises, Therapeutic activity, Neuromuscular re-education, Balance training, Gait training, Patient/Family education, Self Care, Joint  mobilization, Stair training, Dry Needling, Cryotherapy, Moist heat, Taping, Biofeedback, and Manual therapy  PLAN FOR NEXT SESSION: re-eval and final HEP progressions    Gustavus Bryant, PT 09/12/22 2:48 PM

## 2022-09-19 ENCOUNTER — Ambulatory Visit: Payer: Federal, State, Local not specified - PPO | Admitting: Physical Therapy

## 2022-09-19 DIAGNOSIS — M6281 Muscle weakness (generalized): Secondary | ICD-10-CM

## 2022-09-19 DIAGNOSIS — N3942 Incontinence without sensory awareness: Secondary | ICD-10-CM

## 2022-09-19 NOTE — Therapy (Signed)
OUTPATIENT PHYSICAL THERAPY FEMALE PELVIC TREATMENT   Patient Name: [name] MRN: [id number] DOB:04/06/1961, 61 y.o., female Today's Date: 09/19/2022   PT End of Session - 09/19/22 1400     Visit Number 8    Date for PT Re-Evaluation 09/19/22    Authorization Type BCBS    PT Start Time 1400    PT Stop Time 1440    PT Time Calculation (min) 40 min    Activity Tolerance Patient tolerated treatment well    Behavior During Therapy [facility] for tasks assessed/performed                    Past Medical History:  Diagnosis Date   Arthritis    Cancer ([place])    skin cancer chest   Cervical high risk HPV (human papillomavirus) test positive 11/2012   Normal cytology recommend repeat Pap with HPV one year   LGSIL (low grade squamous intraepithelial dysplasia)    07/2001   Urinary incontinence    [facility]I   Past Surgical History:  Procedure Laterality Date   COLPOSCOPY     HAND SURGERY  2012   RIGHT HAND MIDDLE FINGER   LASIK     [place]   TONSILLECTOMY AND ADENOIDECTOMY     Patient Active Problem List   Diagnosis Date Noted   Vitamin D deficiency 12/03/2020   Dysuria 12/03/2020   Urinary frequency 12/03/2020   COVID-19 virus infection 10/08/2019   Hypertriglyceridemia 10/08/2019   Encounter for well adult exam with abnormal findings 10/08/2019   HLD (hyperlipidemia) 10/08/2019   Blood pressure elevated without history of HTN 10/08/2019    PCP: Biagio Borg, MD   REFERRING PROVIDER: Tamela Gammon , NP  REFERRING DIAG: Overactive Bladder    THERAPY DIAG:  Urinary incontinence without sensory awareness  Muscle weakness (generalized)  Rationale for Evaluation and Treatment Rehabilitation  ONSET DATE: 09/19/2022   SUBJECTIVE:                                                                                                                                                                                           SUBJECTIVE  STATEMENT: 09/19/2022:  Had a sneezing fit and I still had leakage with that. When I am exercises I am still working on everything and think it going better.    Fluid intake: Yes: 5x 16 oz   cup hot tea in morning and at lunch    PAIN:  Are you having pain? No   PRECAUTIONS: None  WEIGHT BEARING RESTRICTIONS No  FALLS:  Has patient fallen in last 6 months? No  LIVING ENVIRONMENT:  Lives with: lives with their family and lives with their spouse Lives in: House/apartment Stairs: Yes: Internal: 2 flights steps;   Has following equipment at home: None  OCCUPATION: Retired   PLOF: Independent physically active everyday   PATIENT GOALS Not to have to focus on where to use the bathroom.   PERTINENT HISTORY:   Sexual abuse: No  BOWEL MOVEMENT Pain with bowel movement: No Type of bowel movement:Type (Bristol Stool Scale) 2/3 Fully empty rectum: Yes:   Leakage: No Pads: No Fiber supplement:   URINATION Pain with urination: No history of bladder infection  Fully empty bladder: Yes: takes longer  Stream:  Medium stream  Urgency: Yes: in the morning  Frequency: morning every 30 min and not as frequent in the evenings  Leakage: Sneezing, hiking,lifting, and jumping  Pads: Yes: 3/4 a day   INTERCOURSE Pain with intercourse:  no  Ability to have vaginal penetration:  Yes:   Climax: yes   PREGNANCY Vaginal deliveries 2 Tearing Yes: w/ stitches  Currently pregnant No  PROLAPSE None    OBJECTIVE:   PATIENT SURVEYS:   PFIQ-7    COGNITION:  Overall cognitive status: Within functional limits for tasks assessed     MUSCLE LENGTH: Hamstrings: Right 80 deg; Left 80 deg  LUMBAR SPECIAL TESTS:  Straight leg raise test: Negative  FUNCTIONAL TESTS:  Single leg stand- right SIJ unlocked   GAIT:  Comments: WFL               POSTURE: anterior pelvic tilt and trunk shifted to right    PELVIC ALIGNMENT: Anterior rotation of left in standing; left ilium upslip  in supine   LUMBARAROM/PROM  A/PROM A/PROM  eval  Flexion 75%  Extension   Right lateral flexion   Left lateral flexion   Right rotation   Left rotation    (Blank rows = not tested)  LOWER EXTREMITY ROM:  Passive ROM Right eval Left eval  Hip flexion    Hip extension    Hip abduction    Hip adduction    Hip internal rotation    Hip external rotation  75%  Knee flexion    Knee extension    Ankle dorsiflexion    Ankle plantarflexion    Ankle inversion    Ankle eversion     (Blank rows = not tested)  LOWER EXTREMITY MMT:  MMT Right eval Left eval  Hip flexion 5/5   Hip extension    Hip abduction 4+/5   Hip adduction 5/5   Hip internal rotation    Hip external rotation    Knee flexion    Knee extension    Ankle dorsiflexion    Ankle plantarflexion    Ankle inversion    Ankle eversion      PALPATION:   General: Lumbar paraspinals tight                External Perineal Exam normal but not a lot of movement with contraction and bulging                              Internal Pelvic Floor 2/5 mmt, 10 quick flicks, 2 second hold, slow relaxation, with contraction posterior pelvic til with glute and abdominal contraction          Patient confirms identification and approves PT to assess internal pelvic floor and treatment Yes No emotional/communication barriers or cognitive limitation. Patient is motivated to  learn. Patient understands and agrees with treatment goals and plan. PT explains patient will be examined in standing, sitting, and lying down to see how their muscles and joints work. When they are ready, they will be asked to remove their underwear so PT can examine their perineum. The patient is also given the option of providing their own chaperone as one is not provided in our facility. The patient also has the right and is explained the right to defer or refuse any part of the evaluation or treatment including the internal exam. With the patient's consent,  PT will use one gloved finger to gently assess the muscles of the pelvic floor, seeing how well it contracts and relaxes and if there is muscle symmetry. After, the patient will get dressed and PT and patient will discuss exam findings and plan of care. PT and patient discuss plan of care, schedule, attendance policy and HEP activities.   PELVIC MMT:   MMT eval 09/07/2022  Vaginal 2/5  2/5, 12 seconds   Internal Anal Sphincter    External Anal Sphincter    Puborectalis    Diastasis Recti 1 finger from umbilicus and xiphoid process    (Blank rows = not tested)        TONE: normal  PROLAPSE: n/a  TODAY'S  TREATMENT 09/19/2022 Manual therapy:  STM to lumbar paraspinals and bil hip flexors  Trigger Point Dry-Needling  Treatment instructions: Expect mild to moderate muscle soreness. S/S of pneumothorax if dry needled over a lung field, and to seek immediate medical attention should they occur. Patient verbalized understanding of these instructions and education.  Patient Consent Given: Yes Education handout provided: previously provided Muscles treated: lumbar multifidi and bil hip flexors Treatment response/outcome: increase in muscle length and decrease muscle tone Neuro re-ed: TC and VC to engage pelvic floor correctly without inhaling or expanding ribcage - used yoga block 3 ways  09/12/2022 Manual therapy: Patient confirms identification and approved physical therapist to perform muscle strength and integrity assessment - reassessment of pelvic floor strength and endurance  STM to lumbar paraspinals and right glute   Trigger Point Dry-Needling  Treatment instructions: Expect mild to moderate muscle soreness. S/S of pneumothorax if dry needled over a lung field, and to seek immediate medical attention should they occur. Patient verbalized understanding of these instructions and education.  Patient Consent Given: Yes Education handout provided: Yes Muscles treated: lumbar  multifidi and right glute med  Treatment response/outcome: increase in muscle length and decrease muscle   Exercise: Leg press 90# 2x15 Hip abductor machine 1x15 bil 60# Hip flexion 1x15 60# Hip extension 1x15 60# Pallof 1x12  UE cable - 7lb rotation with lawn mower into lunge Side lunge holding 10lb - 15x bil Diagonal lunge slider - 15x each side  Stretches: Lumbar rotation  Single leg hold  Bear hold rocks Vibration lumbar flexion Vibration hamstring stretch  TREATMENT 09/07/2022 Manual therapy: Patient confirms identification and approved physical therapist to perform muscle strength and integrity assessment - reassessment of pelvic floor strength and endurance  STM to lumbar paraspinals and right glute   Trigger Point Dry-Needling  Treatment instructions: Expect mild to moderate muscle soreness. S/S of pneumothorax if dry needled over a lung field, and to seek immediate medical attention should they occur. Patient verbalized understanding of these instructions and education.  Patient Consent Given: Yes Education handout provided: Yes Muscles treated: lumbar multifidi and right glute med  Treatment response/outcome: increase in muscle length and decrease muscle  Exercise: Leg press 70# 2x12 Hip abductor machine 2x12 bil 60# Hip flexion 1x12 60# Hip extension 1x12 60# Pallof 1x12 green theraband  Stretches: Lumbar rotation  Single leg hold  Bear hold rocks    08/10/2022 Exercise: Supine marches with arm press down with Diaphragmatic breathing  and pelvic floor contraction  Dead bug holds with Diaphragmatic breathing  and pelvic floor contraction 2x30s Hooklying with arms reaching overhead 1x12 Pallof with 13# cable machine- Diaphragmatic breathing   Bungee walking forward and back Bungee resisted marches 1x10 bil  Mini lunges on 2 foam pads 1x12 bil Tall knee to half with 2#  Tall knee to half kneel with resisted 3# cable machine sing le arm  90/90 toe taps  1x15 bil Deadbug 1x10 Vibration plate squats 30 sec  Hamstring stretch 30sec Vibration plate  Hip flexor stretch 30 sec bil vibration plate  Posterior pelvic tilt on wall left arms 1x12 Self care: Educated on [employer] and how to use with exercises as well as purchase information    PATIENT EDUCATION:  Education details:HEP, added lunge with sliders Person educated: Patient Education method: Consulting civil engineer, Media planner, and Handouts Education comprehension: verbalized understanding   HOME EXERCISE PROGRAM: Access Code: [email or web address] URL: [email or web address] Date: 07/25/2022 Prepared by: Jari Favre Exercises - kegel with hip internal rotation - 1 x daily - 7 x weekly - 1 sets - 10 reps - Prone Pelvic Floor Contraction on Swiss Ball - 1 x daily - 7 x weekly - 1 sets - 10 reps - Quadruped Circle Weight Shifts - 1 x daily - 7 x weekly - 3 sets - 10 reps - Swiss Ball March - 1 x daily - 7 x weekly - 3 sets - 10 reps - Quadruped Cat Cow - 1 x daily - 7 x weekly - 3 sets - 10 reps  ASSESSMENT:  CLINICAL IMPRESSION: Pt is ind with final HEP.  Reviewed muscle coordination and did a final treatment of dry needling for reduced low back tension and improve muscle balance in her trunk.  Pt will benefit from d/c with HEP today.   OBJECTIVE IMPAIRMENTS decreased coordination, decreased endurance, decreased knowledge of condition, decreased ROM, decreased strength, impaired flexibility, and impaired tone.   ACTIVITY LIMITATIONS carrying, lifting, bending, squatting, stairs, and continence  PARTICIPATION LIMITATIONS: community activity and yard work  [place] Age and Time since onset of injury/illness/exacerbation are also affecting patient's functional outcome.   REHAB POTENTIAL: Good  CLINICAL DECISION MAKING: Evolving/moderate complexity  EVALUATION COMPLEXITY: Moderate   GOALS: Goals reviewed with patient? Yes  SHORT TERM GOALS: Target date: follow up   Updated: 08/29/2022   Patient will be compliant with basic HEP program to ensure understanding of exercise  Baseline: doing 6 days a week  Goal status: MET   LONG TERM GOALS: Target date: 09/19/2022 Updated: 09/19/2022   Patient will be able maintain a pelvic floor contraction for 15 seconds to ensure functional pelvic floor endurance to help with urinary incontinence  Baseline: 12 seconds Goal status: NOT MET  2.  Patient will report the use of 1-2 pads throughout day to demonstrate greater muscle coordination of pelvic floor Baseline: 2-3 on average  Goal status: Not met   3.  Patient will be independent with advance HEP to maintain improvements made throughout therapy  Baseline:  Goal status: MET  4.  Patient will be able to do community activities without having the fear to find the restroom.  Baseline: not JIC peeing anymore Goal status: Better,  not met  5.  Patient will be able to squat 20# without having leakage to demonstrate proper pelvic floor engagement  Baseline: able to do without leakage Goal status: MET    PLAN: PT FREQUENCY: 1x/week  PT DURATION: 12 weeks  PLANNED INTERVENTIONS: Therapeutic exercises, Therapeutic activity, Neuromuscular re-education, Balance training, Gait training, Patient/Family education, Self Care, Joint mobilization, Stair training, Dry Needling, Cryotherapy, Moist heat, Taping, Biofeedback, and Manual therapy  PLAN FOR NEXT SESSION: d/c today    Gustavus Bryant, PT 09/19/22 2:01 PM  PHYSICAL THERAPY DISCHARGE SUMMARY  Visits from Start of Care: 8  Current functional level related to goals / functional outcomes: See above goals   Remaining deficits: See above details   Education / Equipment: HEP   Patient agrees to discharge. Patient goals were met. Patient is being discharged due to being pleased with the current functional level.  Most goals met  Gustavus Bryant, PT 09/19/22 2:41 PM

## 2022-09-20 DIAGNOSIS — L820 Inflamed seborrheic keratosis: Secondary | ICD-10-CM | POA: Diagnosis not present

## 2022-09-26 ENCOUNTER — Other Ambulatory Visit: Payer: Self-pay | Admitting: Nurse Practitioner

## 2022-09-26 DIAGNOSIS — N3281 Overactive bladder: Secondary | ICD-10-CM

## 2022-09-27 NOTE — Telephone Encounter (Signed)
Medication refill request: myrbetriq Refill authorized: #30 with no rf to get her to her aex.

## 2022-10-03 ENCOUNTER — Other Ambulatory Visit: Payer: Self-pay | Admitting: Nurse Practitioner

## 2022-10-03 DIAGNOSIS — G4700 Insomnia, unspecified: Secondary | ICD-10-CM

## 2022-10-03 NOTE — Telephone Encounter (Signed)
RX request Ambien '5mg'$ 

## 2022-10-05 NOTE — Telephone Encounter (Signed)
AEX 10/06/21. Not yet scheduled for 2024.

## 2022-10-26 ENCOUNTER — Other Ambulatory Visit: Payer: Self-pay | Admitting: *Deleted

## 2022-10-26 DIAGNOSIS — N3281 Overactive bladder: Secondary | ICD-10-CM

## 2022-10-26 MED ORDER — MIRABEGRON ER 25 MG PO TB24: 25.0000 mg | ORAL_TABLET | Freq: Every day | ORAL | 0 refills | Status: DC

## 2022-10-26 NOTE — Telephone Encounter (Signed)
Medication refill request: Myrbetriq  Last AEX:  10-06-21 TW Next AEX: 10-30-22  Last MMG (if hormonal medication request): n/a Refill authorized: Please advise.   Medication pended for #30, 0RF. Please refill if appropriate.

## 2022-10-30 ENCOUNTER — Ambulatory Visit (INDEPENDENT_AMBULATORY_CARE_PROVIDER_SITE_OTHER): Payer: Federal, State, Local not specified - PPO | Admitting: Nurse Practitioner

## 2022-10-30 ENCOUNTER — Encounter: Payer: Self-pay | Admitting: Nurse Practitioner

## 2022-10-30 VITALS — BP 132/82 | HR 66 | Ht 60.63 in | Wt 145.0 lb

## 2022-10-30 DIAGNOSIS — Z780 Asymptomatic menopausal state: Secondary | ICD-10-CM

## 2022-10-30 DIAGNOSIS — N3941 Urge incontinence: Secondary | ICD-10-CM | POA: Diagnosis not present

## 2022-10-30 DIAGNOSIS — R350 Frequency of micturition: Secondary | ICD-10-CM | POA: Diagnosis not present

## 2022-10-30 DIAGNOSIS — Z01419 Encounter for gynecological examination (general) (routine) without abnormal findings: Secondary | ICD-10-CM | POA: Diagnosis not present

## 2022-10-30 MED ORDER — TROSPIUM CHLORIDE ER 60 MG PO CP24: 1.0000 | ORAL_CAPSULE | ORAL | 0 refills | Status: DC

## 2022-10-30 NOTE — Progress Notes (Signed)
Cornell Carapia 12/26/1960 [id number]   History:  62 y.o. [id number] presents for annual exam. Postmenopausal - no HRT, no bleeding. 2002 LGSIL, 2014 normal cytology positive HR HPV negative 16/18/45. Takes Ambien for insomnia. Mixed incontinence. Has not noticed much of a difference since starting Myrbetriq and it is expensive. Would like to retry Trospium Chloride. Felt this worked better but did cause some dry mouth. Recently did pelvic floor PT but did not notice much improvement.   Gynecologic History Patient's last menstrual period was 07/29/2018.   Contraception/Family planning: post menopausal status Sexually active: Yes  Health Maintenance Last Pap: 10/06/2021. Results were: Normal, 3-year repeat Last mammogram: 03/03/2022. Results were: Normal Last colonoscopy: 10/10/2013. Results were: benign polyp, 10-year recall Last Dexa: Not indicated   Past medical history, past surgical history, family history and social history were all reviewed and documented in the EPIC chart. 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 20 yo daughter, lives in [place]. Son 46 yo in [employer], stationed in [place].   ROS:  A ROS was performed and pertinent positives and negatives are included.  Exam:  Vitals:   10/30/22 1453  BP: 132/82  Pulse: 66  SpO2: 100%  Weight: 145 lb (65.8 kg)  Height: 5' 0.63" (1.54 m)    Body mass index is 27.73 kg/m.  General appearance:  Normal Thyroid:  Symmetrical, normal in size, without palpable masses or nodularity. Respiratory  Auscultation:  Clear without wheezing or rhonchi Cardiovascular  Auscultation:  Regular rate, without rubs, murmurs or gallops  Edema/varicosities:  Not grossly evident Abdominal  Soft,nontender, without masses, guarding or rebound.  Liver/spleen:  No organomegaly noted  Hernia:  None appreciated  Skin  Inspection:  Grossly normal Breasts: Examined lying and sitting.   Right: Without masses, retractions, nipple  discharge or axillary adenopathy.   Left: Without masses, retractions, nipple discharge or axillary adenopathy. Genitourinary   Inguinal/mons:  Normal without inguinal adenopathy  External genitalia:  Normal appearing vulva with no masses, tenderness, or lesions  BUS/Urethra/Skene's glands:  Normal  Vagina:  Normal appearing with normal color and discharge, no lesions  Cervix:  Normal appearing without discharge or lesions  Uterus:  Normal in size, shape and contour.  Midline and mobile, nontender  Adnexa/parametria:     Rt: Normal in size, without masses or tenderness.   Lt: Normal in size, without masses or tenderness.  Anus and perineum: Normal  Digital rectal exam: Deferred  Patient informed chaperone available to be present for breast and pelvic exam. Patient has requested no chaperone to be present. Patient has been advised what will be completed during breast and pelvic exam.   Assessment/Plan:  62 y.o. [id number] for annual exam.   Well female exam with routine gynecological exam - Education provided on SBEs, importance of preventative screenings, current guidelines, high calcium diet, regular exercise, and multivitamin daily.  Labs with PCP.   Postmenopausal - no HRT, no bleeding  Urge incontinence - Plan: Urinalysis,Complete w/RFL Culture, Urine Culture, REFLEXIVE URINE CULTURE, Trospium Chloride 60 MG CP24 each morning. Negative UA. Culture pending. No improvement with Myrbetriq and pelvic floor PT. Did better with Trospium chloride and wants to retry. If no improvement urogyn referral recommended.   Screening for cervical cancer - 2002 LGSIL, 2014 normal cytology positive HR HPV negative 16/18/45. Will repeat at 3-year interval per guidelines.    Screening for breast cancer - Normal mammogram history.  Continue annual screenings.  Normal breast exam today.  Screening for colon cancer -  2015 colonoscopy. Will repeat at 10-year interval per GI's recommendation.   Screening for  osteoporosis - Average risk. Will plan for DXA at age 69. She takes daily Vitamin D + Calcium supplement and exercises daily - lifting and cardio.  Return in 1 year for annual.     Tamela Gammon DNP, 3:49 PM 10/30/2022

## 2022-11-01 LAB — URINE CULTURE
MICRO NUMBER:: 14486052
SPECIMEN QUALITY:: ADEQUATE

## 2022-11-01 LAB — URINALYSIS, COMPLETE W/RFL CULTURE
Bilirubin Urine: NEGATIVE
Casts: NONE SEEN /LPF
Glucose, UA: NEGATIVE
Hyaline Cast: NONE SEEN /LPF
Ketones, ur: NEGATIVE
Nitrites, Initial: NEGATIVE
Protein, ur: NEGATIVE
RBC / HPF: NONE SEEN /HPF (ref 0–2)
Specific Gravity, Urine: 1.015 (ref 1.001–1.035)
Yeast: NONE SEEN /HPF
pH: 7 (ref 5.0–8.0)

## 2022-11-01 LAB — CULTURE INDICATED

## 2022-11-02 ENCOUNTER — Other Ambulatory Visit: Payer: Self-pay

## 2022-11-02 MED ORDER — SULFAMETHOXAZOLE-TRIMETHOPRIM 800-160 MG PO TABS: 1.0000 | ORAL_TABLET | Freq: Two times a day (BID) | ORAL | 0 refills | Status: DC

## 2022-11-03 ENCOUNTER — Other Ambulatory Visit: Payer: Self-pay | Admitting: Nurse Practitioner

## 2022-11-03 DIAGNOSIS — G4700 Insomnia, unspecified: Secondary | ICD-10-CM

## 2022-11-03 NOTE — Telephone Encounter (Signed)
Ambien '5mg'$  Rx last sent 10-05-22 #30 with 0 refills. Aex was 10-30-22. Please approve if appropriate

## 2022-12-04 ENCOUNTER — Telehealth: Payer: Self-pay

## 2022-12-04 NOTE — Telephone Encounter (Signed)
PA started for pt by [facility] for pt's Ambien '5mg'$  rx.   KEYBQ:[id number]  Completed PA, sent to plan, and waiting for response.   PA approved: Case ID: [id number]  [place] and left them a detailed message notifying them of the approval.   Will send to provider for final review and close encounter.

## 2022-12-05 DIAGNOSIS — J343 Hypertrophy of nasal turbinates: Secondary | ICD-10-CM | POA: Diagnosis not present

## 2022-12-05 DIAGNOSIS — T485X5A Adverse effect of other anti-common-cold drugs, initial encounter: Secondary | ICD-10-CM | POA: Diagnosis not present

## 2022-12-05 DIAGNOSIS — J310 Chronic rhinitis: Secondary | ICD-10-CM | POA: Diagnosis not present

## 2023-01-25 ENCOUNTER — Other Ambulatory Visit: Payer: Self-pay | Admitting: Nurse Practitioner

## 2023-01-25 DIAGNOSIS — N3941 Urge incontinence: Secondary | ICD-10-CM

## 2023-01-25 NOTE — Telephone Encounter (Signed)
Last AEX 10/30/2022--recall placed for 2025.  Per OV note: if no improvement w/ incontinence w/ rx, then referral to urogyn. Will reach out to pt to inquire before refill.   LVM, will also send pt mychart msg.

## 2023-01-25 NOTE — Telephone Encounter (Signed)
Pls refer to pt mychart response.  Rx pend.

## 2023-01-30 ENCOUNTER — Other Ambulatory Visit: Payer: Self-pay | Admitting: Nurse Practitioner

## 2023-01-30 DIAGNOSIS — Z1231 Encounter for screening mammogram for malignant neoplasm of breast: Secondary | ICD-10-CM

## 2023-02-02 ENCOUNTER — Other Ambulatory Visit: Payer: Self-pay | Admitting: Nurse Practitioner

## 2023-02-02 DIAGNOSIS — G4700 Insomnia, unspecified: Secondary | ICD-10-CM

## 2023-02-02 NOTE — Telephone Encounter (Signed)
RF request received for zolpidem 5mg . Last AEX 10/30/22. Routed to provider for approval/denial.

## 2023-03-27 ENCOUNTER — Ambulatory Visit
Admission: RE | Admit: 2023-03-27 | Discharge: 2023-03-27 | Disposition: A | Discharge status: Home / Self Care | Payer: Federal, State, Local not specified - PPO | Source: Ambulatory Visit | Attending: Nurse Practitioner | Admitting: Nurse Practitioner

## 2023-03-27 DIAGNOSIS — Z1231 Encounter for screening mammogram for malignant neoplasm of breast: Secondary | ICD-10-CM | POA: Diagnosis not present

## 2023-04-04 DIAGNOSIS — L820 Inflamed seborrheic keratosis: Secondary | ICD-10-CM | POA: Diagnosis not present

## 2023-05-04 ENCOUNTER — Other Ambulatory Visit: Payer: Self-pay | Admitting: Nurse Practitioner

## 2023-05-04 DIAGNOSIS — G4700 Insomnia, unspecified: Secondary | ICD-10-CM

## 2023-05-04 NOTE — Telephone Encounter (Signed)
Med refill request: Ambien Last AEX: 10/30/22 Next AEX: not scheduled Last MMG (if hormonal med) 03/27/23 Refill authorized: Please Advise, #30, 2 RF

## 2023-08-08 ENCOUNTER — Other Ambulatory Visit: Payer: Self-pay

## 2023-08-08 DIAGNOSIS — G4700 Insomnia, unspecified: Secondary | ICD-10-CM

## 2023-08-08 MED ORDER — ZOLPIDEM TARTRATE 5 MG PO TABS: 5.0000 mg | ORAL_TABLET | Freq: Every evening | ORAL | 2 refills | Status: DC | PRN

## 2023-08-08 NOTE — Telephone Encounter (Signed)
Med refill request: zolpidem 5 mg Last AEX:  10/30/22 Next AEX: none Last MMG (if hormonal med) n/a Refill sent to provider for approval or denial.

## 2023-10-31 DIAGNOSIS — D0471 Carcinoma in situ of skin of right lower limb, including hip: Secondary | ICD-10-CM | POA: Diagnosis not present

## 2023-11-04 ENCOUNTER — Other Ambulatory Visit: Payer: Self-pay | Admitting: Nurse Practitioner

## 2023-11-04 DIAGNOSIS — G4700 Insomnia, unspecified: Secondary | ICD-10-CM

## 2023-11-05 NOTE — Telephone Encounter (Signed)
Med refill request: zolpidem 5 mg Last AEX:  10/30/22 Next AEX: 11/20/23 Last MMG (if hormonal med) n/a Refill sent to provider for approval or denial.

## 2023-11-20 ENCOUNTER — Encounter: Payer: Self-pay | Admitting: Nurse Practitioner

## 2023-11-20 ENCOUNTER — Ambulatory Visit (INDEPENDENT_AMBULATORY_CARE_PROVIDER_SITE_OTHER): Payer: Federal, State, Local not specified - PPO | Admitting: Nurse Practitioner

## 2023-11-20 VITALS — BP 118/82 | HR 80 | Ht 61.25 in | Wt 144.0 lb

## 2023-11-20 DIAGNOSIS — Z01419 Encounter for gynecological examination (general) (routine) without abnormal findings: Secondary | ICD-10-CM | POA: Diagnosis not present

## 2023-11-20 DIAGNOSIS — N3281 Overactive bladder: Secondary | ICD-10-CM | POA: Diagnosis not present

## 2023-11-20 DIAGNOSIS — Z780 Asymptomatic menopausal state: Secondary | ICD-10-CM | POA: Diagnosis not present

## 2023-11-20 DIAGNOSIS — E785 Hyperlipidemia, unspecified: Secondary | ICD-10-CM

## 2023-11-20 MED ORDER — GEMTESA 75 MG PO TABS: 1.0000 | ORAL_TABLET | Freq: Every day | ORAL | 1 refills | Status: DC

## 2023-11-20 NOTE — Progress Notes (Signed)
 [name] 1961-06-28 [id number]   History:  63 y.o. [id number] presents for annual exam. Postmenopausal - no HRT, no bleeding. 2002 LGSIL, 2014 normal cytology positive HR HPV negative 16/18/45. Takes Ambien for insomnia. OAB - Tried pelvic floor PT with no improvement. Did not have improvement with Myrbetriq and it was expensive, so she restarted Trospium Chloride. Wants to discuss alternative.   Gynecologic History Patient's last menstrual period was 07/29/2018.   Contraception/Family planning: post menopausal status Sexually active: Yes  Health Maintenance Last Pap: 10/06/2021. Results were: Normal Last mammogram: 03/27/2023. Results were: Normal Last colonoscopy: 10/10/2013. Results were: benign polyp, 10-year recall Last Dexa: Not indicated   Past medical history, past surgical history, family history and social history were all reviewed and documented in the EPIC chart. Married. Retired Pensions consultant. 81 yo daughter, lives in [place]. Son 51 yo in [employer], stationed in [place].   ROS:  A ROS was performed and pertinent positives and negatives are included.  Exam:  Vitals:   11/20/23 1532  BP: 118/82  Pulse: 80  SpO2: 98%  Weight: 144 lb (65.3 kg)  Height: 5' 1.25" (1.556 m)     Body mass index is 26.99 kg/m.  General appearance:  Normal Thyroid:  Symmetrical, normal in size, without palpable masses or nodularity. Respiratory  Auscultation:  Clear without wheezing or rhonchi Cardiovascular  Auscultation:  Regular rate, without rubs, murmurs or gallops  Edema/varicosities:  Not grossly evident Abdominal  Soft,nontender, without masses, guarding or rebound.  Liver/spleen:  No organomegaly noted  Hernia:  None appreciated  Skin  Inspection:  Grossly normal Breasts: Examined lying and sitting.   Right: Without masses, retractions, nipple discharge or axillary adenopathy.   Left: Without masses, retractions, nipple discharge or axillary adenopathy. Pelvic: External  genitalia:  no lesions              Urethra:  normal appearing urethra with no masses, tenderness or lesions              Bartholins and Skenes: normal                 Vagina: normal appearing vagina with normal color and discharge, no lesions              Cervix: no lesions Bimanual Exam:  Uterus:  no masses or tenderness              Adnexa: no mass, fullness, tenderness              Rectovaginal: Deferred              Anus:  normal, no lesions  Patient informed chaperone available to be present for breast and pelvic exam. Patient has requested no chaperone to be present. Patient has been advised what will be completed during breast and pelvic exam.   Assessment/Plan:  63 y.o. [id number] for annual exam.   Well female exam with routine gynecological exam - Plan: CBC with Differential/Platelet, Comprehensive metabolic panelEducation provided on SBEs, importance of preventative screenings, current guidelines, high calcium diet, regular exercise, and multivitamin daily.  Planning to find new PCP. Requesting lab. Will return for fasting labs.   Postmenopausal - no HRT, no bleeding  Overactive bladder - Plan: Vibegron (GEMTESA) 75 MG TABS daily. Aware will be sent to specialty pharmacy.   Hyperlipidemia, unspecified hyperlipidemia type - Plan: Lipid panel  Screening for cervical cancer - 2002 LGSIL, 2014 normal cytology positive HR HPV negative 16/18/45. Will repeat at 3-year interval per  guidelines.    Screening for breast cancer - Normal mammogram history.  Continue annual screenings.  Normal breast exam today.  Screening for colon cancer - 2015 colonoscopy. Due now and plans to follow up with GI.   Screening for osteoporosis - Average risk. Will plan for DXA at age 70. She takes daily Vitamin D + Calcium supplement and exercises daily - lifting and cardio.  Return in about 1 year (around 11/19/2024) for Annual.    Olivia Mackie DNP, 3:56 PM 11/20/2023

## 2023-11-22 ENCOUNTER — Encounter: Payer: Self-pay | Admitting: Nurse Practitioner

## 2023-11-26 NOTE — Telephone Encounter (Signed)
 Left message to call [facility] Triage at [phone], option 4.

## 2023-12-05 ENCOUNTER — Other Ambulatory Visit: Payer: Federal, State, Local not specified - PPO

## 2023-12-05 DIAGNOSIS — E785 Hyperlipidemia, unspecified: Secondary | ICD-10-CM | POA: Diagnosis not present

## 2023-12-05 DIAGNOSIS — Z01419 Encounter for gynecological examination (general) (routine) without abnormal findings: Secondary | ICD-10-CM | POA: Diagnosis not present

## 2023-12-06 ENCOUNTER — Encounter: Payer: Self-pay | Admitting: Nurse Practitioner

## 2023-12-06 LAB — CBC WITH DIFFERENTIAL/PLATELET
Absolute Lymphocytes: 2361 {cells}/uL (ref 850–3900)
Absolute Monocytes: 681 {cells}/uL (ref 200–950)
Basophils Absolute: 59 {cells}/uL (ref 0–200)
Basophils Relative: 0.8 %
Eosinophils Absolute: 259 {cells}/uL (ref 15–500)
Eosinophils Relative: 3.5 %
HCT: 41.9 % (ref 35.0–45.0)
Hemoglobin: 14.4 g/dL (ref 11.7–15.5)
MCH: 29.7 pg (ref 27.0–33.0)
MCHC: 34.4 g/dL (ref 32.0–36.0)
MCV: 86.4 fL (ref 80.0–100.0)
MPV: 9.7 fL (ref 7.5–12.5)
Monocytes Relative: 9.2 %
Neutro Abs: 4040 {cells}/uL (ref 1500–7800)
Neutrophils Relative %: 54.6 %
Platelets: 236 10*3/uL (ref 140–400)
RBC: 4.85 10*6/uL (ref 3.80–5.10)
RDW: 13.5 % (ref 11.0–15.0)
Total Lymphocyte: 31.9 %
WBC: 7.4 10*3/uL (ref 3.8–10.8)

## 2023-12-06 LAB — COMPREHENSIVE METABOLIC PANEL
AG Ratio: 1.7 (calc) (ref 1.0–2.5)
ALT: 18 U/L (ref 6–29)
AST: 21 U/L (ref 10–35)
Albumin: 4.7 g/dL (ref 3.6–5.1)
Alkaline phosphatase (APISO): 101 U/L (ref 37–153)
BUN: 14 mg/dL (ref 7–25)
CO2: 26 mmol/L (ref 20–32)
Calcium: 9.5 mg/dL (ref 8.6–10.4)
Chloride: 103 mmol/L (ref 98–110)
Creat: 0.77 mg/dL (ref 0.50–1.05)
Globulin: 2.7 g/dL (ref 1.9–3.7)
Glucose, Bld: 94 mg/dL (ref 65–99)
Potassium: 4.4 mmol/L (ref 3.5–5.3)
Sodium: 139 mmol/L (ref 135–146)
Total Bilirubin: 0.6 mg/dL (ref 0.2–1.2)
Total Protein: 7.4 g/dL (ref 6.1–8.1)

## 2023-12-06 LAB — LIPID PANEL
Cholesterol: 227 mg/dL — ABNORMAL HIGH (ref <200)
HDL: 62 mg/dL (ref >=50)
LDL Cholesterol (Calc): 143 mg/dL — ABNORMAL HIGH
Non-HDL Cholesterol (Calc): 165 mg/dL — ABNORMAL HIGH (ref <130)
Total CHOL/HDL Ratio: 3.7 (calc) (ref <5.0)
Triglycerides: 104 mg/dL (ref <150)

## 2023-12-17 ENCOUNTER — Other Ambulatory Visit: Payer: Self-pay | Admitting: Nurse Practitioner

## 2023-12-17 DIAGNOSIS — N3281 Overactive bladder: Secondary | ICD-10-CM

## 2023-12-18 NOTE — Telephone Encounter (Signed)
 Med refill request: Gemtesa Last AEX: 11/20/2023-TW Next AEX: nothing currently, recall placed for 2026.  Last MMG (if hormonal med): n/a Refill authorized: rx denied for now.   Pt reports at first, or what seemed like when she started taking it daily initially, she wasn't feeling well. However, decided to change it to q other day instead (so, doesn't need a refill at the moment) and it is working ok for her at this time. However, would like to give it some more time before officially deciding if she wants to continue on it or will reach out if desires a refill.

## 2024-01-07 ENCOUNTER — Encounter: Payer: Self-pay | Admitting: Nurse Practitioner

## 2024-01-08 NOTE — Telephone Encounter (Signed)
 PA through covermymeds submitted and approved.   Key: [id number]  Pt notified via mychart msg. Routing to provider for final review and encounter closed.

## 2024-01-08 NOTE — Telephone Encounter (Signed)
 Routing to [place] to assist with PA.

## 2024-01-09 ENCOUNTER — Encounter: Payer: Self-pay | Admitting: Pediatrics

## 2024-01-16 ENCOUNTER — Other Ambulatory Visit: Payer: Self-pay

## 2024-01-16 ENCOUNTER — Encounter: Payer: Self-pay | Admitting: Nurse Practitioner

## 2024-01-16 DIAGNOSIS — N3281 Overactive bladder: Secondary | ICD-10-CM

## 2024-01-16 MED ORDER — GEMTESA 75 MG PO TABS: 1.0000 | ORAL_TABLET | Freq: Every day | ORAL | 5 refills | Status: DC

## 2024-01-16 NOTE — Telephone Encounter (Signed)
 Refill request sent to provider.

## 2024-01-16 NOTE — Telephone Encounter (Signed)
 Medication refill request: Gemtesa Last AEX:  11/20/23 Next AEX: not yet scheduled  Last MMG (if hormonal medication request): n/a Refill authorized: patient requested via mychart

## 2024-01-17 ENCOUNTER — Other Ambulatory Visit: Payer: Self-pay

## 2024-01-17 DIAGNOSIS — N3281 Overactive bladder: Secondary | ICD-10-CM

## 2024-01-17 MED ORDER — GEMTESA 75 MG PO TABS: 1.0000 | ORAL_TABLET | Freq: Every day | ORAL | 5 refills | Status: DC

## 2024-01-17 NOTE — Telephone Encounter (Signed)
 Spoke to patient regarding Gemtesa prescription.  RX was sent to [facility] yesterday.  Patient states she normally received medication through mail order and pharmacy needs new RX. (Patient thought mail order pharmacy was [facility], I see [facility] as last mail order) Please approve or deny as appropriate.

## 2024-02-04 ENCOUNTER — Other Ambulatory Visit: Payer: Self-pay | Admitting: Nurse Practitioner

## 2024-02-04 DIAGNOSIS — G4700 Insomnia, unspecified: Secondary | ICD-10-CM

## 2024-02-04 NOTE — Telephone Encounter (Signed)
 Medication refill request: ambien   Last AEX:  11/20/23  Next AEX: not scheduled  Last MMG (if hormonal medication request): n/a Refill authorized: please advise

## 2024-02-07 ENCOUNTER — Ambulatory Visit (AMBULATORY_SURGERY_CENTER)

## 2024-02-07 ENCOUNTER — Encounter: Payer: Self-pay | Admitting: Pediatrics

## 2024-02-07 VITALS — Ht 61.25 in | Wt 135.0 lb

## 2024-02-07 DIAGNOSIS — Z1211 Encounter for screening for malignant neoplasm of colon: Secondary | ICD-10-CM

## 2024-02-07 MED ORDER — NA SULFATE-K SULFATE-MG SULF 17.5-3.13-1.6 GM/177ML PO SOLN: 1.0000 | Freq: Once | ORAL | 0 refills | Status: AC

## 2024-02-07 NOTE — Progress Notes (Signed)

## 2024-02-19 NOTE — Progress Notes (Signed)
 [facility] History and Physical   Primary Care Physician:  Roslyn Coombe, MD   Reason for Procedure:  Colorectal cancer screening  Plan:    Screening colonoscopy     HPI: [name] is a 63 y.o. female undergoing screening colonoscopy for colorectal cancer screening.  Patient underwent colonoscopy in 2015.  This disclosed 1 hyperplastic polyp.  No family history of colorectal cancer or polyps.  Patient denies current symptoms of change in bowel habits or rectal bleeding.   Past Medical History:  Diagnosis Date   Arthritis    Cancer (HCC)    skin cancer chest   Cervical high risk HPV (human papillomavirus) test positive 11/2012   Normal cytology recommend repeat Pap with HPV one year   LGSIL (low grade squamous intraepithelial dysplasia)    07/2001   Urinary incontinence    USI    Past Surgical History:  Procedure Laterality Date   COLPOSCOPY     HAND SURGERY  2012   RIGHT HAND MIDDLE FINGER   LASIK     MOUTH SURGERY  1981   TONSILLECTOMY AND ADENOIDECTOMY      Prior to Admission medications   Medication Sig Start Date End Date Taking? Authorizing Provider  ASPIRIN 81 PO Take by mouth.    [provider]  bimatoprost (LATISSE) 0.03 % ophthalmic solution 1 drop daily. Patient not taking: Reported on 02/07/2024 04/24/22   [provider]  calcium  citrate-vitamin D  (CITRACAL+D) 315-200 MG-UNIT per tablet Take 1 tablet by mouth 2 (two) times daily.    [provider]  Cyanocobalamin (B-12) 3000 MCG CAPS Take by mouth.    [provider]  fish oil-omega-3 fatty acids 1000 MG capsule Take 720 mg by mouth daily.    [provider]  Multiple Vitamin (MULTIVITAMIN) tablet Take 1 tablet by mouth daily.    [provider]  psyllium (METAMUCIL) 58.6 % powder Take 1 packet by mouth 3 (three) times daily.    [provider]  Trospium  Chloride 60 MG CP24 Take 1 capsule by mouth daily. Patient not taking:  Reported on 02/07/2024 01/17/24   [provider]  TURMERIC PO Take by mouth. Patient not taking: Reported on 02/07/2024    [provider]  Vibegron  (GEMTESA ) 75 MG TABS Take 1 tablet (75 mg total) by mouth daily. 01/17/24   Andee Bamberger, NP  zolpidem  (AMBIEN ) 5 MG tablet Take 1 tablet (5 mg total) by mouth at bedtime as needed for sleep 02/05/24   Andee Bamberger, NP    Current Outpatient Medications  Medication Sig Dispense Refill   ASPIRIN 81 PO Take by mouth.     bimatoprost (LATISSE) 0.03 % ophthalmic solution 1 drop daily.     calcium  citrate-vitamin D  (CITRACAL+D) 315-200 MG-UNIT per tablet Take 1 tablet by mouth 2 (two) times daily.     Cyanocobalamin (B-12) 3000 MCG CAPS Take by mouth.     fish oil-omega-3 fatty acids 1000 MG capsule Take 720 mg by mouth daily.     Multiple Vitamin (MULTIVITAMIN) tablet Take 1 tablet by mouth daily.     psyllium (METAMUCIL) 58.6 % powder Take 1 packet by mouth 3 (three) times daily.     Vibegron  (GEMTESA ) 75 MG TABS Take 1 tablet (75 mg total) by mouth daily. 30 tablet 5   zolpidem  (AMBIEN ) 5 MG tablet Take 1 tablet (5 mg total) by mouth at bedtime as needed for sleep 30 tablet 2   Trospium  Chloride 60 MG  CP24 Take 1 capsule by mouth daily. (Patient not taking: Reported on 02/07/2024)     TURMERIC PO Take by mouth. (Patient not taking: Reported on 02/21/2024)     Current Facility-Administered Medications  Medication Dose Route Frequency Provider Last Rate Last Admin   0.9 %  sodium chloride  infusion  500 mL Intravenous Once Earleen Aoun M, MD        Allergies as of 02/21/2024 - Review Complete 02/21/2024  Allergen Reaction Noted   Penicillins Rash 10/10/2011    Family History  Problem Relation Age of Onset   Hypertension Mother    Stroke Mother    Seizures Mother    Atrial fibrillation Mother    Diabetes Father    Hypertension Father    Cancer Father        SKIN   Kidney disease Father    Kidney Stones Father     Kidney Stones Sister    Kidney Stones Brother    Stroke Maternal Grandmother    Colon cancer Neg Hx    Colon polyps Neg Hx    Esophageal cancer Neg Hx    Rectal cancer Neg Hx    Stomach cancer Neg Hx     Social History   Socioeconomic History   Marital status: Married    Spouse name: Not on file   Number of children: 2   Years of education: Not on file   Highest education level: Not on file  Occupational History   Not on file  Tobacco Use   Smoking status: Never   Smokeless tobacco: Never  Vaping Use   Vaping status: Never Used  Substance and Sexual Activity   Alcohol use: Yes    Alcohol/week: 2.0 - 3.0 standard drinks of alcohol    Types: 2 - 3 Cans of beer per week    Comment: 1-2 BEERS FOR WEEK   Drug use: No   Sexual activity: Not Currently    Birth control/protection: Post-menopausal    Comment: 1st intercourse 34 yo-5 partners  Other Topics Concern   Not on file  Social History Narrative   Not on file   Social Drivers of Health   Financial Resource Strain: Not on file  Food Insecurity: Not on file  Transportation Needs: Not on file  Physical Activity: Not on file  Stress: Not on file  Social Connections: Not on file  Intimate Partner Violence: Not on file    Review of Systems:  All other review of systems negative except as mentioned in the HPI.  Physical Exam: Vital signs BP (!) 151/91   Pulse 68   Temp 98.1 F (36.7 C) (Skin)   Resp 16   Ht 5' 1.25" (1.556 m)   Wt 135 lb (61.2 kg)   LMP 07/29/2018   SpO2 99%   BMI 25.30 kg/m   General:   Alert,  Well-developed, well-nourished, pleasant and cooperative in NAD Airway:  Mallampati 2 Lungs:  Clear throughout to auscultation.   Heart:  Regular rate and rhythm; no murmurs, clicks, rubs,  or gallops. Abdomen:  Soft, nontender and nondistended. Normal bowel sounds.   Neuro/Psych:  Normal mood and affect. A and O x 3  Eugenia Hess, MD [facility]

## 2024-02-21 ENCOUNTER — Ambulatory Visit (AMBULATORY_SURGERY_CENTER): Admitting: Pediatrics

## 2024-02-21 ENCOUNTER — Encounter: Payer: Self-pay | Admitting: Pediatrics

## 2024-02-21 VITALS — BP 113/54 | HR 88 | Temp 98.1°F | Resp 11 | Ht 61.25 in | Wt 135.0 lb

## 2024-02-21 DIAGNOSIS — K648 Other hemorrhoids: Secondary | ICD-10-CM

## 2024-02-21 DIAGNOSIS — K6289 Other specified diseases of anus and rectum: Secondary | ICD-10-CM | POA: Diagnosis not present

## 2024-02-21 DIAGNOSIS — Z1211 Encounter for screening for malignant neoplasm of colon: Secondary | ICD-10-CM | POA: Diagnosis not present

## 2024-02-21 DIAGNOSIS — K621 Rectal polyp: Secondary | ICD-10-CM

## 2024-02-21 DIAGNOSIS — D128 Benign neoplasm of rectum: Secondary | ICD-10-CM

## 2024-02-21 MED ORDER — SODIUM CHLORIDE 0.9 % IV SOLN: 500.0000 mL | Freq: Once | INTRAVENOUS | Status: DC

## 2024-02-21 NOTE — Progress Notes (Signed)
 Vss nad trans to pacu

## 2024-02-21 NOTE — Progress Notes (Signed)
 Pt's states no medical or surgical changes since previsit or office visit.

## 2024-02-21 NOTE — Progress Notes (Signed)
 Called to room to assist during endoscopic procedure.  Patient ID and intended procedure confirmed with present staff. Received instructions for my participation in the procedure from the performing physician.

## 2024-02-21 NOTE — Patient Instructions (Signed)

## 2024-02-21 NOTE — Op Note (Signed)
 [facility] Patient Name: [name] Procedure Date: 02/21/2024 9:35 AM MRN: [id number] Endoscopist: Eugenia Hess , MD, [id number] Age: 63 Referring MD:  Date of Birth: [birth year] Gender: Female Account #: [id number] Procedure:                Colonoscopy Indications:              Screening for colorectal malignant neoplasm, Last                            colonoscopy: 2015 -1 small hyperplastic polyp                            identified on last colonoscopy Medicines:                Monitored Anesthesia Care Procedure:                Pre-Anesthesia Assessment:                           - Prior to the procedure, a History and Physical                            was performed, and patient medications and                            allergies were reviewed. The patient's tolerance of                            previous anesthesia was also reviewed. The risks                            and benefits of the procedure and the sedation                            options and risks were discussed with the patient.                            All questions were answered, and informed consent                            was obtained. Prior Anticoagulants: The patient has                            taken no anticoagulant or antiplatelet agents. ASA                            Grade Assessment: II - A patient with mild systemic                            disease. After reviewing the risks and benefits,                            the patient was deemed in satisfactory condition to  undergo the procedure.                           After obtaining informed consent, the colonoscope                            was passed under direct vision. Throughout the                            procedure, the patient's blood pressure, pulse, and                            oxygen saturations were monitored continuously. The                            Olympus Scope SN: [id number] was  introduced through                            the anus and advanced to the terminal ileum. The                            colonoscopy was performed without difficulty. The                            patient tolerated the procedure well. The quality                            of the bowel preparation was good. The terminal                            ileum, ileocecal valve, appendiceal orifice, and                            rectum were photographed. Scope In: 9:39:45 AM Scope Out: 9:53:14 AM Scope Withdrawal Time: 0 hours 10 minutes 21 seconds  Total Procedure Duration: 0 hours 13 minutes 29 seconds  Findings:                 The perianal and digital rectal examinations were                            normal. Pertinent negatives include normal                            sphincter tone and no palpable rectal lesions.                           Two sessile polyps were found in the rectum. The                            polyps were 3 to 4 mm in size. These polyps were                            removed with a cold biopsy forceps. Resection and  retrieval were complete.                           The terminal ileum appeared normal.                           Internal hemorrhoids were found during retroflexion.                           Anal papilla(e) were hypertrophied. Complications:            No immediate complications. Estimated blood loss:                            Minimal. Estimated Blood Loss:     Estimated blood loss was minimal. Impression:               - Two 3 to 4 mm polyps in the rectum, removed with                            a cold biopsy forceps. Resected and retrieved.                           - The examined portion of the ileum was normal.                           - Internal hemorrhoids.                           - Anal papilla(e) were hypertrophied. Recommendation:           - Discharge patient to home (ambulatory).                           -  Await pathology results.                           - Repeat colonoscopy for surveillance based on                            pathology results.                           - The findings and recommendations were discussed                            with the patient's family.                           - Return to referring physician.                           - Patient has a contact number available for                            emergencies. The signs and symptoms of potential  delayed complications were discussed with the                            patient. Return to normal activities tomorrow.                            Written discharge instructions were provided to the                            patient. Eugenia Hess, MD 02/21/2024 9:58:00 AM This report has been signed electronically.

## 2024-02-22 ENCOUNTER — Telehealth: Payer: Self-pay

## 2024-02-22 NOTE — Telephone Encounter (Signed)
  Follow up Call-     02/21/2024    8:36 AM  Call back number  Post procedure Call Back phone  # [phone]  Permission to leave phone message Yes     Patient questions:  Do you have a fever, pain , or abdominal swelling? No. Pain Score  0 *  Have you tolerated food without any problems? Yes.    Have you been able to return to your normal activities? Yes.    Do you have any questions about your discharge instructions: Diet   No. Medications  No. Follow up visit  No.  Do you have questions or concerns about your Care? No.  Actions: * If pain score is 4 or above: No action needed, pain <4.

## 2024-02-26 ENCOUNTER — Ambulatory Visit: Payer: Self-pay | Admitting: Pediatrics

## 2024-02-26 LAB — SURGICAL PATHOLOGY

## 2024-03-31 ENCOUNTER — Other Ambulatory Visit: Payer: Self-pay | Admitting: Nurse Practitioner

## 2024-03-31 DIAGNOSIS — Z1231 Encounter for screening mammogram for malignant neoplasm of breast: Secondary | ICD-10-CM

## 2024-04-24 DIAGNOSIS — H2513 Age-related nuclear cataract, bilateral: Secondary | ICD-10-CM | POA: Diagnosis not present

## 2024-04-29 ENCOUNTER — Ambulatory Visit
Admission: RE | Admit: 2024-04-29 | Discharge: 2024-04-29 | Disposition: A | Discharge status: Home / Self Care | Source: Ambulatory Visit | Attending: Nurse Practitioner | Admitting: Nurse Practitioner

## 2024-04-29 DIAGNOSIS — Z1231 Encounter for screening mammogram for malignant neoplasm of breast: Secondary | ICD-10-CM | POA: Diagnosis not present

## 2024-05-05 ENCOUNTER — Other Ambulatory Visit: Payer: Self-pay | Admitting: Nurse Practitioner

## 2024-05-05 DIAGNOSIS — G4700 Insomnia, unspecified: Secondary | ICD-10-CM

## 2024-05-05 NOTE — Telephone Encounter (Signed)
.  Med refill request: Zolpidem  Last AEX: 11/20/23 Next AEX: not scheduled  Last MMG (if hormonal med) 04/29/24 BI-RADS CATEGORY 1: Negative.  Refill authorized: Please Advise?

## 2024-08-01 ENCOUNTER — Other Ambulatory Visit: Payer: Self-pay | Admitting: Nurse Practitioner

## 2024-08-01 DIAGNOSIS — G4700 Insomnia, unspecified: Secondary | ICD-10-CM

## 2024-08-01 NOTE — Telephone Encounter (Signed)
.  Med refill request: Ambien   Last AEX: 11/20/23 Next AEX: Needs to be scheduled sent message to the front desk  Last MMG (if hormonal med) Refill authorized: Please Advise?

## 2024-08-07 ENCOUNTER — Telehealth: Payer: Self-pay

## 2024-08-07 NOTE — Telephone Encounter (Signed)
 Copied from CRM #[id number]. Topic: Clinical - Request for Lab/Test Order >> Aug 07, 2024 11:19 AM [name] wrote: Reason for CRM: Patient called in would like labs for her NP appointment for 12/10 would like a callback once order is in to be scheduled

## 2024-08-11 NOTE — Telephone Encounter (Signed)
 LM for pt that [facility] does not do labs prior to appts and will need to wait to get labs at NP visit. Provided office number for questions/concerns

## 2024-08-18 ENCOUNTER — Other Ambulatory Visit: Payer: Self-pay | Admitting: Nurse Practitioner

## 2024-08-18 DIAGNOSIS — N3281 Overactive bladder: Secondary | ICD-10-CM

## 2024-08-19 NOTE — Telephone Encounter (Signed)
 Med refill request: *Vibegron  (GEMTESA ) 75 MG TABS  Start:  01/17/24 Disp:  30 tablets Refills:  5  Last AEX:  2/18//25 Next AEX:  Not yet scheduled Last MMG (if hormonal med):  04/29/24 Refill authorized? Please Advise.

## 2024-09-10 ENCOUNTER — Ambulatory Visit: Admitting: Family Medicine

## 2024-09-10 ENCOUNTER — Encounter: Payer: Self-pay | Admitting: Family Medicine

## 2024-09-10 VITALS — BP 130/90 | HR 73 | Temp 98.1°F | Ht 61.25 in | Wt 144.8 lb

## 2024-09-10 DIAGNOSIS — R002 Palpitations: Secondary | ICD-10-CM | POA: Diagnosis not present

## 2024-09-10 DIAGNOSIS — F5101 Primary insomnia: Secondary | ICD-10-CM

## 2024-09-10 DIAGNOSIS — E559 Vitamin D deficiency, unspecified: Secondary | ICD-10-CM

## 2024-09-10 DIAGNOSIS — E7800 Pure hypercholesterolemia, unspecified: Secondary | ICD-10-CM

## 2024-09-10 LAB — TSH: TSH: 2.5 u[IU]/mL (ref 0.35–5.50)

## 2024-09-10 LAB — CBC WITH DIFFERENTIAL/PLATELET
Basophils Absolute: 0.1 K/uL (ref 0.0–0.1)
Basophils Relative: 1.1 % (ref 0.0–3.0)
Eosinophils Absolute: 0.6 K/uL (ref 0.0–0.7)
Eosinophils Relative: 5.5 % — ABNORMAL HIGH (ref 0.0–5.0)
HCT: 42.9 % (ref 36.0–46.0)
Hemoglobin: 14.6 g/dL (ref 12.0–15.0)
Lymphocytes Relative: 33.6 % (ref 12.0–46.0)
Lymphs Abs: 3.3 K/uL (ref 0.7–4.0)
MCHC: 34.1 g/dL (ref 30.0–36.0)
MCV: 86.5 fl (ref 78.0–100.0)
Monocytes Absolute: 0.8 K/uL (ref 0.1–1.0)
Monocytes Relative: 8.1 % (ref 3.0–12.0)
Neutro Abs: 5.2 K/uL (ref 1.4–7.7)
Neutrophils Relative %: 51.7 % (ref 43.0–77.0)
Platelets: 262 K/uL (ref 150.0–400.0)
RBC: 4.95 Mil/uL (ref 3.87–5.11)
RDW: 13.7 % (ref 11.5–15.5)
WBC: 10 K/uL (ref 4.0–10.5)

## 2024-09-10 LAB — COMPREHENSIVE METABOLIC PANEL WITH GFR
ALT: 19 U/L (ref 0–35)
AST: 23 U/L (ref 0–37)
Albumin: 4.9 g/dL (ref 3.5–5.2)
Alkaline Phosphatase: 103 U/L (ref 39–117)
BUN: 12 mg/dL (ref 6–23)
CO2: 28 meq/L (ref 19–32)
Calcium: 9.6 mg/dL (ref 8.4–10.5)
Chloride: 101 meq/L (ref 96–112)
Creatinine, Ser: 0.72 mg/dL (ref 0.40–1.20)
GFR: 89.79 mL/min (ref >60.00)
Glucose, Bld: 81 mg/dL (ref 70–99)
Potassium: 3.7 meq/L (ref 3.5–5.1)
Sodium: 138 meq/L (ref 135–145)
Total Bilirubin: 0.4 mg/dL (ref 0.2–1.2)
Total Protein: 7.8 g/dL (ref 6.0–8.3)

## 2024-09-10 LAB — LIPID PANEL
Cholesterol: 230 mg/dL — ABNORMAL HIGH (ref 0–200)
HDL: 58.1 mg/dL (ref >39.00)
LDL Cholesterol: 133 mg/dL — ABNORMAL HIGH (ref 0–99)
NonHDL: 171.88
Total CHOL/HDL Ratio: 4
Triglycerides: 193 mg/dL — ABNORMAL HIGH (ref 0.0–149.0)
VLDL: 38.6 mg/dL (ref 0.0–40.0)

## 2024-09-10 LAB — T4, FREE: Free T4: 0.87 ng/dL (ref 0.60–1.60)

## 2024-09-10 LAB — VITAMIN D 25 HYDROXY (VIT D DEFICIENCY, FRACTURES): VITD: 42 ng/mL (ref 30.00–100.00)

## 2024-09-10 LAB — MAGNESIUM: Magnesium: 2.2 mg/dL (ref 1.5–2.5)

## 2024-09-10 NOTE — Progress Notes (Signed)
 New Patient Office Visit  Subjective    Patient ID: [name], female    DOB: 05/03/1961  Age: 63 y.o. MRN: [id number]  CC:  Chief Complaint  Patient presents with   Establish Care    Wants to discuss blood work had a smoothie this morning at 7am  C/o heart palpitations ongoing for months      Discussed the use of AI scribe software for clinical note transcription with the patient, who gave verbal consent to proceed.  History of Present Illness [name] is a 63 year old female with hyperlipidemia who presents to establish care and follow up on chronic health conditions.  Hyperlipidemia - LDL 143 mg/dL in March 2025 - Never taken statins - Prefers to avoid medication if possible - Open to repeat lipid testing and lipoprotein(a) for cardiovascular risk assessment - Consumed a smoothie prior to last blood draw, which may have affected results  Palpitations - Weekly episodes for 6-7 months - Each episode lasts a few seconds - No associated dizziness, lightheadedness, shortness of breath, or peripheral edema - Coronary calcium  score in 2022: LAD score 23 (81st percentile) - Increased physical activity and improved diet since 2022  Insomnia - Uses Ambien  almost nightly - Previously trialed Unisom and melatonin without benefit - History of caregiving stress related to mother and husband - Currently maintains good sleep hygiene, low caffeine intake, and a flexible schedule in retirement     Outpatient Encounter Medications as of 09/10/2024  Medication Sig   ASPIRIN 81 PO Take by mouth.   bimatoprost (LATISSE) 0.03 % ophthalmic solution 1 drop daily.   calcium  citrate-vitamin D  (CITRACAL+D) 315-200 MG-UNIT per tablet Take 1 tablet by mouth 2 (two) times daily. (Patient taking differently: Take 1 tablet by mouth 2 (two) times daily. Once a day)   fish oil-omega-3 fatty acids 1000 MG capsule Take 720 mg by mouth daily. (Patient taking differently: Take 720 mg by mouth daily.  12000 mg)   Multiple Vitamin (MULTIVITAMIN) tablet Take 1 tablet by mouth daily.   Vibegron  (GEMTESA ) 75 MG TABS TAKE ONE (1) TABLET (75 MG TOTAL) BY MOUTH DAILY.   zolpidem  (AMBIEN ) 5 MG tablet Take 1 tablet (5 mg total) by mouth at bedtime as needed for sleep   [DISCONTINUED] psyllium (METAMUCIL) 58.6 % powder Take 1 packet by mouth 3 (three) times daily. (Patient taking differently: Take 1 packet by mouth 3 (three) times daily. gummies)   [DISCONTINUED] Cyanocobalamin (B-12) 3000 MCG CAPS Take by mouth.   [DISCONTINUED] Trospium  Chloride 60 MG CP24 Take 1 capsule by mouth daily. (Patient not taking: Reported on 02/07/2024)   [DISCONTINUED] TURMERIC PO Take by mouth. (Patient not taking: Reported on 02/21/2024)   No facility-administered encounter medications on file as of 09/10/2024.    Past Medical History:  Diagnosis Date   Arthritis    Cancer (HCC)    skin cancer chest   Cervical high risk HPV (human papillomavirus) test positive 11/2012   Normal cytology recommend repeat Pap with HPV one year   LGSIL (low grade squamous intraepithelial dysplasia)    07/2001   Urinary incontinence    USI    Past Surgical History:  Procedure Laterality Date   COLPOSCOPY     HAND SURGERY  2012   RIGHT HAND MIDDLE FINGER   LASIK     MOUTH SURGERY  1981   TONSILLECTOMY AND ADENOIDECTOMY      Family History  Problem Relation Age of Onset   Hypertension Mother  Stroke Mother    Seizures Mother    Atrial fibrillation Mother    Diabetes Father    Hypertension Father    Cancer Father        SKIN   Kidney disease Father    Kidney Stones Father    Kidney Stones Sister    Kidney Stones Brother    Stroke Maternal Grandmother    Colon cancer Neg Hx    Colon polyps Neg Hx    Esophageal cancer Neg Hx    Rectal cancer Neg Hx    Stomach cancer Neg Hx     Social History   Socioeconomic History   Marital status: Married    Spouse name: Not on file   Number of children: 2   Years of  education: Not on file   Highest education level: Professional school degree (e.g., MD, DDS, DVM, JD)  Occupational History   Not on file  Tobacco Use   Smoking status: Never   Smokeless tobacco: Never  Vaping Use   Vaping status: Never Used  Substance and Sexual Activity   Alcohol use: Yes    Alcohol/week: 2.0 - 3.0 standard drinks of alcohol    Types: 2 - 3 Cans of beer per week    Comment: 1-2 BEERS FOR WEEK   Drug use: No   Sexual activity: Not Currently    Birth control/protection: Post-menopausal    Comment: 1st intercourse 20 yo-5 partners  Other Topics Concern   Not on file  Social History Narrative   Not on file   Social Drivers of Health   Financial Resource Strain: Low Risk  (09/09/2024)   Overall Financial Resource Strain (CARDIA)    Difficulty of Paying Living Expenses: Not hard at all  Food Insecurity: No Food Insecurity (09/09/2024)   Hunger Vital Sign    Worried About Running Out of Food in the Last Year: Never true    Ran Out of Food in the Last Year: Never true  Transportation Needs: No Transportation Needs (09/09/2024)   PRAPARE - Administrator, Civil Service (Medical): No    Lack of Transportation (Non-Medical): No  Physical Activity: Sufficiently Active (09/09/2024)   Exercise Vital Sign    Days of Exercise per Week: 6 days    Minutes of Exercise per Session: 70 min  Stress: Stress Concern Present (09/09/2024)   [employer] of Occupational Health - Occupational Stress Questionnaire    Feeling of Stress: To some extent  Social Connections: Socially Integrated (09/09/2024)   Social Connection and Isolation Panel    Frequency of Communication with Friends and Family: More than three times a week    Frequency of Social Gatherings with Friends and Family: Three times a week    Attends Religious Services: More than 4 times per year    Active Member of Clubs or Organizations: Yes    Attends Engineer, Structural: More than 4 times per  year    Marital Status: Married  Catering Manager Violence: Not on file    ROS Per HPI     Objective    BP (!) 130/90 (BP Location: Right Arm, Patient Position: Sitting)   Pulse 73   Temp 98.1 F (36.7 C) (Temporal)   Ht 5' 1.25 (1.556 m)   Wt 144 lb 12.8 oz (65.7 kg)   LMP 07/29/2018   SpO2 97%   BMI 27.14 kg/m   Physical Exam Constitutional:      General: She is not in acute  distress.    Appearance: She is not ill-appearing.  HENT:     Mouth/Throat:     Mouth: Mucous membranes are moist.     Pharynx: Oropharynx is clear.  Eyes:     Extraocular Movements: Extraocular movements intact.     Conjunctiva/sclera: Conjunctivae normal.     Pupils: Pupils are equal, round, and reactive to light.  Cardiovascular:     Rate and Rhythm: Normal rate and regular rhythm.  Pulmonary:     Effort: Pulmonary effort is normal.     Breath sounds: Normal breath sounds.  Musculoskeletal:     Cervical back: Normal range of motion and neck supple.     Right lower leg: No edema.     Left lower leg: No edema.  Skin:    General: Skin is warm and dry.  Neurological:     General: No focal deficit present.     Mental Status: She is alert and oriented to person, place, and time.     Motor: No weakness.     Coordination: Coordination normal.     Gait: Gait normal.  Psychiatric:        Mood and Affect: Mood normal.        Behavior: Behavior normal.        Thought Content: Thought content normal.         Assessment & Plan:   Problem List Items Addressed This Visit     HLD (hyperlipidemia)   Relevant Orders   Lipid panel (Completed)   Lipoprotein A (LPA)   Vitamin D  deficiency   Relevant Orders   VITAMIN D  25 Hydroxy (Vit-D Deficiency, Fractures) (Completed)   Other Visit Diagnoses       Palpitations    -  Primary   Relevant Orders   CBC with Differential/Platelet (Completed)   Comprehensive metabolic panel with GFR (Completed)   Magnesium (Completed)   TSH (Completed)    T4, free (Completed)   EKG 12-Lead     Primary insomnia       Relevant Orders   TSH (Completed)   T4, free (Completed)       Assessment and Plan Assessment & Plan Palpitations Intermittent palpitations occurring weekly for the past 6-7 months, lasting a few seconds without associated symptoms such as dizziness or shortness of breath. Previous coronary calcium  score in 2022 was 23. No strong family history of heart disease. - Ordered EKG today - offered two-week cardiac monitor but she will first try using [employer]ia mobile - Checked thyroid function and electrolytes   Pure hypercholesterolemia Elevated LDL cholesterol at 143 mg/dL in March. No previous cholesterol medication use. Prefers to avoid medication. Plan to assess cardiovascular risk using lipoprotein(a) and 10-year ASCVD risk calculator. Decision to start medication will depend on comprehensive risk assessment. - Ordered lipid panel and lipoprotein(a) - Calculated 10-year ASCVD risk - Will discuss potential need for cholesterol medication based on risk assessment  The 10-year ASCVD risk score (Arnett DK, et al., 2019) is: 4.5%   Values used to calculate the score:     Age: 65 years     Clincally relevant sex: Female     Is Non-Hispanic African American: No     Diabetic: No     Tobacco smoker: No     Systolic Blood Pressure: 130 mmHg     Is BP treated: No     HDL Cholesterol: 58.1 mg/dL     Total Cholesterol: 230 mg/dL   Primary insomnia Chronic insomnia managed with  nightly Ambien  use. Concerns about long-term use and potential risks, including dementia and fall risk. Discussed sleep hygiene and potential strategies to reduce Ambien  use. She is open to reducing Ambien  use and exploring alternative strategies. - Advised reducing Ambien  use by cutting tablets in half or using every other night - Encouraged continued sleep hygiene practices  Vitamin D  deficiency Currently taking over-the-counter vitamin D3  supplement. - Continue current vitamin D3 supplementation   EKG shows NSR, rate 70, no ischemia. No old ones    No follow-ups on file.   Boby Mackintosh, NP-C

## 2024-09-10 NOTE — Patient Instructions (Signed)
 Thank you for trusting us  with your health care.  Please go downstairs for labs before you leave today.  I will be in touch with your results and with recommendations.  Let me know if you would like for me to order the cardiac monitor.

## 2024-09-16 ENCOUNTER — Ambulatory Visit: Payer: Self-pay | Admitting: Family Medicine

## 2024-09-16 NOTE — Progress Notes (Signed)
 Please check on her lipoprotein (a) test result. It is overdue. Thanks.

## 2024-09-17 ENCOUNTER — Encounter: Payer: Self-pay | Admitting: Family Medicine

## 2024-09-17 LAB — LIPOPROTEIN A (LPA): Lipoprotein (a): 14 nmol/L (ref <75)

## 2024-10-15 ENCOUNTER — Emergency Department (HOSPITAL_COMMUNITY)
Admission: EM | Admit: 2024-10-15 | Discharge: 2024-10-15 | Disposition: A | Discharge status: Home / Self Care | Attending: Emergency Medicine | Admitting: Emergency Medicine

## 2024-10-15 ENCOUNTER — Other Ambulatory Visit: Payer: Self-pay

## 2024-10-15 ENCOUNTER — Emergency Department (HOSPITAL_COMMUNITY)

## 2024-10-15 ENCOUNTER — Encounter (HOSPITAL_COMMUNITY): Payer: Self-pay | Admitting: *Deleted

## 2024-10-15 DIAGNOSIS — R519 Headache, unspecified: Secondary | ICD-10-CM | POA: Diagnosis present

## 2024-10-15 DIAGNOSIS — M62838 Other muscle spasm: Secondary | ICD-10-CM | POA: Insufficient documentation

## 2024-10-15 DIAGNOSIS — G44209 Tension-type headache, unspecified, not intractable: Secondary | ICD-10-CM | POA: Diagnosis not present

## 2024-10-15 MED ORDER — OXYCODONE-ACETAMINOPHEN 5-325 MG PO TABS
1.0000 | ORAL_TABLET | Freq: Once | ORAL | Status: AC
  Administered 2024-10-15: 1 via ORAL
  Filled 2024-10-15: qty 1

## 2024-10-15 MED ORDER — SODIUM CHLORIDE 0.9 % IV BOLUS
1000.0000 mL | Freq: Once | INTRAVENOUS | Status: AC
  Administered 2024-10-15: 1000 mL via INTRAVENOUS

## 2024-10-15 MED ORDER — METHOCARBAMOL 1000 MG/10ML IJ SOLN
1000.0000 mg | Freq: Once | INTRAMUSCULAR | Status: AC
  Administered 2024-10-15: 1000 mg via INTRAVENOUS
  Filled 2024-10-15: qty 10

## 2024-10-15 MED ORDER — ONDANSETRON HCL 4 MG/2ML IJ SOLN
4.0000 mg | Freq: Once | INTRAMUSCULAR | Status: AC
  Administered 2024-10-15: 4 mg via INTRAVENOUS
  Filled 2024-10-15: qty 2

## 2024-10-15 MED ORDER — DEXAMETHASONE SOD PHOSPHATE PF 10 MG/ML IJ SOLN
10.0000 mg | Freq: Once | INTRAMUSCULAR | Status: AC
  Administered 2024-10-15: 10 mg via INTRAVENOUS
  Filled 2024-10-15: qty 1

## 2024-10-15 MED ORDER — PROCHLORPERAZINE EDISYLATE 10 MG/2ML IJ SOLN
10.0000 mg | Freq: Once | INTRAMUSCULAR | Status: AC
  Administered 2024-10-15: 10 mg via INTRAVENOUS
  Filled 2024-10-15: qty 2

## 2024-10-15 MED ORDER — METHOCARBAMOL 500 MG PO TABS: 500.0000 mg | ORAL_TABLET | Freq: Four times a day (QID) | ORAL | 0 refills | Status: AC | PRN

## 2024-10-15 NOTE — ED Provider Notes (Signed)
 "  EMERGENCY DEPARTMENT AT [facility] Provider Note   CSN: [id number] Arrival date & time: 10/15/24  9966     Patient presents with: Torticollis   [name] is a 64 y.o. female.   The history is provided by the patient.   She has been complaining of pain in her upper neck radiating into her head for about the last month.  She had x-rays which showed some arthritis in her neck.  Symptoms have been getting worse over the last week and got much worse today.  She has been taking ibuprofen and naproxen without relief.  Pain starts at her upper neck and radiates to the base of her skull and around to the frontal region of the skull.  There is some photophobia but no phonophobia.  She denies any visual change, weakness, numbness, tingling.  There has been no nausea or vomiting.  At triage she was given a dose of oxycodone -acetaminophen  and did develop nausea and vomiting following that.  She was noted to have low blood pressure and was moved to the treatment room.  He states that following the oxycodone -acetaminophen , she is no longer having any photophobia and the pain in her frontal region seems to have improved.    Prior to Admission medications  Medication Sig Start Date End Date Taking? Authorizing Provider  ASPIRIN 81 PO Take by mouth.    [provider]  bimatoprost (LATISSE) 0.03 % ophthalmic solution 1 drop daily. 04/24/22   [provider]  calcium  citrate-vitamin D  (CITRACAL+D) 315-200 MG-UNIT per tablet Take 1 tablet by mouth 2 (two) times daily. Patient taking differently: Take 1 tablet by mouth 2 (two) times daily. Once a day    [provider]  fish oil-omega-3 fatty acids 1000 MG capsule Take 720 mg by mouth daily. Patient taking differently: Take 720 mg by mouth daily. 12000 mg    [provider]  Multiple Vitamin (MULTIVITAMIN) tablet Take 1 tablet by mouth daily.    [provider]  Vibegron  (GEMTESA ) 75 MG TABS  TAKE ONE (1) TABLET (75 MG TOTAL) BY MOUTH DAILY. 08/19/24   Prentiss Annabella LABOR, NP  zolpidem  (AMBIEN ) 5 MG tablet Take 1 tablet (5 mg total) by mouth at bedtime as needed for sleep 08/04/24   Prentiss Annabella LABOR, NP    Allergies: Penicillins    Review of Systems  All other systems reviewed and are negative.   Updated Vital Signs BP 104/64   Pulse 71   Temp 98.3 F (36.8 C)   Resp 14   LMP 07/29/2018   SpO2 97%   Physical Exam Vitals and nursing note reviewed.   64 year old female, resting comfortably and in no acute distress. Vital signs were initially significant for elevated blood pressure which then dropped very low following episode of emesis and now has returned to the lower range of normal. Oxygen saturation is 97%, which is normal. Head is normocephalic and atraumatic. PERRLA, EOMI. there is mild tenderness to palpation over the temporalis muscles bilaterally, moderate pain with palpation of the insertion of the paracervical muscles bilaterally. Neck is nontender, but there is pain with passive range of motion.  There is moderate spasm of the paracervical muscles bilaterally. Lungs are clear without rales, wheezes, or rhonchi. Heart has regular rate and rhythm without murmur. Skin is warm and dry without rash. Neurologic: Awake and alert and oriented, cranial nerves are intact, strength is 5/5 in all 4 extremities.  (all labs ordered are  listed, but only abnormal results are displayed) Labs Reviewed - No data to display  EKG: None  Radiology: CT Cervical Spine Wo Contrast Result Date: 10/15/2024 EXAM: CT CERVICAL SPINE WITHOUT CONTRAST 10/15/2024 12:57:06 AM TECHNIQUE: CT of the cervical spine was performed without the administration of intravenous contrast. Multiplanar reformatted images are provided for review. Automated exposure control, iterative reconstruction, and/or weight based adjustment of the mA/kV was utilized to reduce the radiation dose to as low as  reasonably achievable. COMPARISON: None available. CLINICAL HISTORY: Neck pain, acute, no red flags. The patient presents with acute neck pain without any red flag symptoms. FINDINGS: BONES AND ALIGNMENT: Diffusely decreased bone density. Grade 1 anterolisthesis of C3 on C4, C4 on C5, C5 on C6, C6 on C7, and C7 on T1. No acute fracture or traumatic malalignment. DEGENERATIVE CHANGES: No significant degenerative changes. SOFT TISSUES: No prevertebral soft tissue swelling. IMPRESSION: 1. No acute cervical spine abnormality. Electronically signed by: Morgane Naveau MD 10/15/2024 01:11 AM EST RP Workstation: [id number]   CT Head Wo Contrast Result Date: 10/15/2024 EXAM: CT HEAD WITHOUT CONTRAST 10/15/2024 12:57:06 AM TECHNIQUE: CT of the head was performed without the administration of intravenous contrast. Automated exposure control, iterative reconstruction, and/or weight based adjustment of the mA/kV was utilized to reduce the radiation dose to as low as reasonably achievable. COMPARISON: None available. CLINICAL HISTORY: The patient reports headaches that are increasing in frequency and severity. FINDINGS: BRAIN AND VENTRICLES: No acute hemorrhage. No evidence of acute infarct. No hydrocephalus. No extra-axial collection. No mass effect or midline shift. ORBITS: No acute abnormality. SINUSES: No acute abnormality. SOFT TISSUES AND SKULL: No acute soft tissue abnormality. No skull fracture. IMPRESSION: 1. No acute intracranial abnormality. Electronically signed by: Morgane Naveau MD 10/15/2024 01:04 AM EST RP Workstation: [id number]     Procedures   Medications Ordered in the ED  oxyCODONE -acetaminophen  (PERCOCET/ROXICET) 5-325 MG per tablet 1 tablet (1 tablet Oral Given 10/15/24 0058)  sodium chloride  0.9 % bolus 1,000 mL (1,000 mLs Intravenous New Bag/Given 10/15/24 0223)  ondansetron  (ZOFRAN ) injection 4 mg (4 mg Intravenous Given 10/15/24 0223)  prochlorperazine  (COMPAZINE ) injection 10 mg (10 mg  Intravenous Given 10/15/24 0248)  methocarbamol  (ROBAXIN ) injection 1,000 mg (1,000 mg Intravenous Given 10/15/24 0248)  dexamethasone  (DECADRON ) injection 10 mg (10 mg Intravenous Given 10/15/24 0248)                                    Medical Decision Making Risk Prescription drug management.   Neck pain with headache.  Pattern of headache seems most consistent with a muscle contraction headache.  Consider migraine variant.  Doubt more serious pathology such as subarachnoid hemorrhage or meningitis.  CT scans of head and cervical spine obtained at triage showed no acute abnormalities with slight anterolisthesis at multiple levels from C3-T1.  I independently viewed the images, and agree with the radiologist's interpretation.  IV fluids had been ordered following her episode of hypotension, I have added orders for prochlorperazine , methocarbamol , dexamethasone .  Following above-noted treatment, headache was completely gone and neck pain was significantly improved.  She was able to spontaneously move her head about 30 degrees in either direction which she was not able to do before.  I am discharging her with instructions to apply ice, use over-the-counter NSAIDs and acetaminophen  as needed.  I am giving her a prescription for methocarbamol  to use as needed.  Follow-up with her orthopedic physicians.  Final diagnoses:  Muscle contraction headache  Cervical paraspinal muscle spasm    ED Discharge Orders          Ordered    methocarbamol  (ROBAXIN ) 500 MG tablet  Every 6 hours PRN        10/15/24 0357               Raford Lenis, MD 10/15/24 [phone]  "

## 2024-10-15 NOTE — ED Notes (Signed)
 Pt appears pale and diaphoretic and states that she is nauseated and not feeling well.

## 2024-10-15 NOTE — ED Provider Triage Note (Addendum)
 Emergency Medicine Provider Triage Evaluation Note  [name] , a 64 y.o. female  was evaluated in triage.  Pt complains of headache and neck pain.  Hx of migraines, saw PCP recently and told she had arthritis in her neck which they think is exacerbating her symptoms.  She denies any numbness or weakness of her arms or legs.  Due to have MRI next week.  Review of Systems  Positive: Headache, neck pain Negative: fever  Physical Exam  BP (!) 168/98   Pulse 94   Temp 98.3 F (36.8 C)   Resp 16   LMP 07/29/2018   SpO2 99%  Gen:   Awake, no distress   Resp:  Normal effort  MSK:   Moves extremities without difficulty  Other:  Tenderness along base of skull, no deformities noted, pain elicited with ROM, seems to have some spasm along right trapezius, no rigidity present  Medical Decision Making  Medically screening exam initiated at 12:40 AM.  Appropriate orders placed.  Jaide Guinyard was informed that the remainder of the evaluation will be completed by another provider, this initial triage assessment does not replace that evaluation, and the importance of remaining in the ED until their evaluation is complete.  Headache, neck pain.  No meningeal signs or neurologic deficits.  Will obtain CT head/ cervical spine.  Pain meds given.  2:06 AM Brought back to triage due to vomiting.  BP 80's/60's.  She did have percocet earlier and usually does not take pain medication.  Will given zofran  and IVFB.  She was taken to treatment room for stabilization.   Jarold Olam HERO, PA-C 10/15/24 0042    Jarold Olam HERO, PA-C 10/15/24 [phone]

## 2024-10-15 NOTE — Discharge Instructions (Addendum)
 Apply ice for 30 minutes at a time, 4 times a day.  You may take either ibuprofen or naproxen as needed for pain, add acetaminophen  as needed to get additional pain relief.  Please be aware that if you combine acetaminophen  with either ibuprofen or naproxen, you get better pain relief than you get from taking either medication by itself.  You may take the muscle relaxer every 6 hours as needed.  Follow-up with your orthopedic office.  Physical therapy may be helpful.  Return to the emergency department if symptoms are not being adequately controlled at home.

## 2024-10-15 NOTE — ED Triage Notes (Signed)
 Pt arrives via [facility] from home. Pt is coming from home with head and neck pain, neck stiffness for about 2 weeks, symptoms lasting <1 day for each episode. Seen by her provider, dx with Arthritis in the neck. Hx of migraine, sensivity light. En route, vitals 144/100, hr 80, rr 18, 99% SPO2.

## 2024-10-20 ENCOUNTER — Encounter: Payer: Self-pay | Admitting: Nurse Practitioner

## 2024-10-30 ENCOUNTER — Other Ambulatory Visit: Payer: Self-pay | Admitting: Nurse Practitioner

## 2024-10-30 DIAGNOSIS — N3281 Overactive bladder: Secondary | ICD-10-CM

## 2024-10-30 NOTE — Telephone Encounter (Signed)
 Med refill request:   Vibegron  (GEMTESA ) 75 MG TABS  Start:  08/19/24 Disp:  30 tablets Refills:  2  Last AEX:  11/20/23 Next AEX:  12/23/24 Last MMG (if hormonal med):  04/29/24 Refill authorized? Please Advise.

## 2024-11-01 ENCOUNTER — Other Ambulatory Visit: Payer: Self-pay | Admitting: Nurse Practitioner

## 2024-11-01 DIAGNOSIS — G4700 Insomnia, unspecified: Secondary | ICD-10-CM

## 2024-11-04 ENCOUNTER — Encounter: Payer: Self-pay | Admitting: Nurse Practitioner

## 2024-11-04 NOTE — Telephone Encounter (Signed)
 Routing to Tiffany to review and advise

## 2024-12-23 ENCOUNTER — Ambulatory Visit: Admitting: Nurse Practitioner
